# Patient Record
Sex: Male | Born: 1955 | Race: White | Hispanic: No | Marital: Single | Smoking: Current some day smoker
Health system: Southern US, Community
[De-identification: ages and names within clinical notes are randomized; demographics above are authoritative.]

## PROBLEM LIST (undated history)

## (undated) DIAGNOSIS — L309 Dermatitis, unspecified: Secondary | ICD-10-CM

## (undated) DIAGNOSIS — E785 Hyperlipidemia, unspecified: Secondary | ICD-10-CM

## (undated) DIAGNOSIS — I10 Essential (primary) hypertension: Secondary | ICD-10-CM

## (undated) DIAGNOSIS — I4891 Unspecified atrial fibrillation: Secondary | ICD-10-CM

## (undated) DIAGNOSIS — J449 Chronic obstructive pulmonary disease, unspecified: Secondary | ICD-10-CM

## (undated) DIAGNOSIS — E119 Type 2 diabetes mellitus without complications: Secondary | ICD-10-CM

## (undated) HISTORY — DX: Hyperlipidemia, unspecified: E78.5

## (undated) HISTORY — DX: Unspecified atrial fibrillation: I48.91

---
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
## 1978-12-31 HISTORY — PX: ANAL FISSURE REPAIR: SHX2312

## 1988-12-31 HISTORY — PX: MOUTH SURGERY: SHX715

## 2019-10-22 ENCOUNTER — Emergency Department (HOSPITAL_COMMUNITY): Payer: Self-pay

## 2019-10-22 ENCOUNTER — Encounter (HOSPITAL_COMMUNITY): Payer: Self-pay

## 2019-10-22 ENCOUNTER — Inpatient Hospital Stay (HOSPITAL_COMMUNITY)
Admission: EM | Admit: 2019-10-22 | Discharge: 2019-10-30 | DRG: 308 | Disposition: A | Discharge status: Home / Self Care | Payer: Self-pay | Attending: Family Medicine | Admitting: Family Medicine

## 2019-10-22 ENCOUNTER — Other Ambulatory Visit: Payer: Self-pay

## 2019-10-22 DIAGNOSIS — Z8249 Family history of ischemic heart disease and other diseases of the circulatory system: Secondary | ICD-10-CM

## 2019-10-22 DIAGNOSIS — Z79899 Other long term (current) drug therapy: Secondary | ICD-10-CM

## 2019-10-22 DIAGNOSIS — I4891 Unspecified atrial fibrillation: Secondary | ICD-10-CM | POA: Diagnosis present

## 2019-10-22 DIAGNOSIS — M79606 Pain in leg, unspecified: Secondary | ICD-10-CM

## 2019-10-22 DIAGNOSIS — M79604 Pain in right leg: Secondary | ICD-10-CM

## 2019-10-22 DIAGNOSIS — I472 Ventricular tachycardia: Secondary | ICD-10-CM | POA: Diagnosis present

## 2019-10-22 DIAGNOSIS — G4730 Sleep apnea, unspecified: Secondary | ICD-10-CM | POA: Diagnosis present

## 2019-10-22 DIAGNOSIS — R600 Localized edema: Secondary | ICD-10-CM

## 2019-10-22 DIAGNOSIS — I10 Essential (primary) hypertension: Secondary | ICD-10-CM | POA: Diagnosis present

## 2019-10-22 DIAGNOSIS — Z20828 Contact with and (suspected) exposure to other viral communicable diseases: Secondary | ICD-10-CM | POA: Diagnosis present

## 2019-10-22 DIAGNOSIS — R0902 Hypoxemia: Secondary | ICD-10-CM | POA: Diagnosis present

## 2019-10-22 DIAGNOSIS — I110 Hypertensive heart disease with heart failure: Secondary | ICD-10-CM | POA: Diagnosis present

## 2019-10-22 DIAGNOSIS — L03119 Cellulitis of unspecified part of limb: Secondary | ICD-10-CM | POA: Diagnosis present

## 2019-10-22 DIAGNOSIS — Z6841 Body Mass Index (BMI) 40.0 and over, adult: Secondary | ICD-10-CM

## 2019-10-22 DIAGNOSIS — I4819 Other persistent atrial fibrillation: Principal | ICD-10-CM | POA: Diagnosis present

## 2019-10-22 DIAGNOSIS — M79605 Pain in left leg: Secondary | ICD-10-CM

## 2019-10-22 DIAGNOSIS — G4733 Obstructive sleep apnea (adult) (pediatric): Secondary | ICD-10-CM | POA: Diagnosis present

## 2019-10-22 DIAGNOSIS — B356 Tinea cruris: Secondary | ICD-10-CM | POA: Diagnosis present

## 2019-10-22 DIAGNOSIS — Z720 Tobacco use: Secondary | ICD-10-CM | POA: Diagnosis present

## 2019-10-22 DIAGNOSIS — Z801 Family history of malignant neoplasm of trachea, bronchus and lung: Secondary | ICD-10-CM

## 2019-10-22 DIAGNOSIS — G8929 Other chronic pain: Secondary | ICD-10-CM | POA: Diagnosis present

## 2019-10-22 DIAGNOSIS — J449 Chronic obstructive pulmonary disease, unspecified: Secondary | ICD-10-CM | POA: Diagnosis present

## 2019-10-22 DIAGNOSIS — I5031 Acute diastolic (congestive) heart failure: Secondary | ICD-10-CM | POA: Diagnosis present

## 2019-10-22 DIAGNOSIS — F17210 Nicotine dependence, cigarettes, uncomplicated: Secondary | ICD-10-CM | POA: Diagnosis present

## 2019-10-22 DIAGNOSIS — I890 Lymphedema, not elsewhere classified: Secondary | ICD-10-CM | POA: Diagnosis present

## 2019-10-22 DIAGNOSIS — I509 Heart failure, unspecified: Secondary | ICD-10-CM

## 2019-10-22 HISTORY — DX: Essential (primary) hypertension: I10

## 2019-10-22 HISTORY — DX: Chronic obstructive pulmonary disease, unspecified: J44.9

## 2019-10-22 HISTORY — DX: Dermatitis, unspecified: L30.9

## 2019-10-22 LAB — COMPREHENSIVE METABOLIC PANEL
ALT: 20 U/L (ref 0–44)
AST: 19 U/L (ref 15–41)
Albumin: 3.2 g/dL — ABNORMAL LOW (ref 3.5–5.0)
Alkaline Phosphatase: 84 U/L (ref 38–126)
Anion gap: 9 (ref 5–15)
BUN: 10 mg/dL (ref 8–23)
CO2: 32 mmol/L (ref 22–32)
Calcium: 9 mg/dL (ref 8.9–10.3)
Chloride: 97 mmol/L — ABNORMAL LOW (ref 98–111)
Creatinine, Ser: 1.06 mg/dL (ref 0.61–1.24)
GFR calc Af Amer: >60 mL/min (ref >60)
GFR calc non Af Amer: >60 mL/min (ref >60)
Glucose, Bld: 101 mg/dL — ABNORMAL HIGH (ref 70–99)
Potassium: 4.2 mmol/L (ref 3.5–5.1)
Sodium: 138 mmol/L (ref 135–145)
Total Bilirubin: 1.1 mg/dL (ref 0.3–1.2)
Total Protein: 6.3 g/dL — ABNORMAL LOW (ref 6.5–8.1)

## 2019-10-22 LAB — CBC WITH DIFFERENTIAL/PLATELET
Abs Immature Granulocytes: 0.02 10*3/uL (ref 0.00–0.07)
Basophils Absolute: 0.1 10*3/uL (ref 0.0–0.1)
Basophils Relative: 1 %
Eosinophils Absolute: 0.1 10*3/uL (ref 0.0–0.5)
Eosinophils Relative: 2 %
HCT: 51.1 % (ref 39.0–52.0)
Hemoglobin: 15.8 g/dL (ref 13.0–17.0)
Immature Granulocytes: 0 %
Lymphocytes Relative: 23 %
Lymphs Abs: 1.5 10*3/uL (ref 0.7–4.0)
MCH: 31.9 pg (ref 26.0–34.0)
MCHC: 30.9 g/dL (ref 30.0–36.0)
MCV: 103 fL — ABNORMAL HIGH (ref 80.0–100.0)
Monocytes Absolute: 0.6 10*3/uL (ref 0.1–1.0)
Monocytes Relative: 9 %
Neutro Abs: 4.4 10*3/uL (ref 1.7–7.7)
Neutrophils Relative %: 65 %
Platelets: 191 10*3/uL (ref 150–400)
RBC: 4.96 MIL/uL (ref 4.22–5.81)
RDW: 15.8 % — ABNORMAL HIGH (ref 11.5–15.5)
WBC: 6.7 10*3/uL (ref 4.0–10.5)
nRBC: 0 % (ref 0.0–0.2)

## 2019-10-22 LAB — URINALYSIS, ROUTINE W REFLEX MICROSCOPIC
Bacteria, UA: NONE SEEN
Bilirubin Urine: NEGATIVE
Glucose, UA: NEGATIVE mg/dL
Hgb urine dipstick: NEGATIVE
Ketones, ur: 5 mg/dL — AB
Leukocytes,Ua: NEGATIVE
Nitrite: NEGATIVE
Protein, ur: 30 mg/dL — AB
Specific Gravity, Urine: 1.017 (ref 1.005–1.030)
pH: 6 (ref 5.0–8.0)

## 2019-10-22 LAB — BRAIN NATRIURETIC PEPTIDE: B Natriuretic Peptide: 247 pg/mL — ABNORMAL HIGH (ref 0.0–100.0)

## 2019-10-22 LAB — MAGNESIUM: Magnesium: 2.1 mg/dL (ref 1.7–2.4)

## 2019-10-22 LAB — PROTIME-INR
INR: 1.2 (ref 0.8–1.2)
Prothrombin Time: 14.6 seconds (ref 11.4–15.2)

## 2019-10-22 LAB — APTT: aPTT: 30 seconds (ref 24–36)

## 2019-10-22 LAB — PHOSPHORUS: Phosphorus: 3.2 mg/dL (ref 2.5–4.6)

## 2019-10-22 MED ORDER — ACETAMINOPHEN 325 MG PO TABS: 650.0000 mg | ORAL_TABLET | Freq: Four times a day (QID) | ORAL | Status: DC | PRN

## 2019-10-22 MED ORDER — ACETAMINOPHEN 650 MG RE SUPP: 650.0000 mg | Freq: Four times a day (QID) | RECTAL | Status: DC | PRN

## 2019-10-22 MED ORDER — DILTIAZEM HCL-DEXTROSE 125-5 MG/125ML-% IV SOLN (PREMIX)
5.0000 mg/h | INTRAVENOUS | Status: DC
  Administered 2019-10-22: 5 mg/h via INTRAVENOUS
  Filled 2019-10-22: qty 125

## 2019-10-22 MED ORDER — METHYLPREDNISOLONE SODIUM SUCC 125 MG IJ SOLR
125.0000 mg | Freq: Once | INTRAMUSCULAR | Status: AC
  Administered 2019-10-22: 125 mg via INTRAVENOUS
  Filled 2019-10-22: qty 2

## 2019-10-22 MED ORDER — PROCHLORPERAZINE EDISYLATE 10 MG/2ML IJ SOLN: 5.0000 mg | INTRAMUSCULAR | Status: DC | PRN

## 2019-10-22 MED ORDER — HEPARIN BOLUS VIA INFUSION
5000.0000 [IU] | Freq: Once | INTRAVENOUS | Status: AC
  Administered 2019-10-22: 5000 [IU] via INTRAVENOUS

## 2019-10-22 MED ORDER — DILTIAZEM HCL 25 MG/5ML IV SOLN
10.0000 mg | Freq: Once | INTRAVENOUS | Status: AC
  Administered 2019-10-22: 10 mg via INTRAVENOUS

## 2019-10-22 MED ORDER — HEPARIN (PORCINE) 25000 UT/250ML-% IV SOLN
1950.0000 [IU]/h | INTRAVENOUS | Status: DC
  Administered 2019-10-22: 1600 [IU]/h via INTRAVENOUS
  Administered 2019-10-23: 1950 [IU]/h via INTRAVENOUS
  Filled 2019-10-22 (×2): qty 250

## 2019-10-22 MED ORDER — LEVALBUTEROL TARTRATE 45 MCG/ACT IN AERO: 2.0000 | INHALATION_SPRAY | Freq: Four times a day (QID) | RESPIRATORY_TRACT | Status: DC

## 2019-10-22 MED ORDER — SODIUM CHLORIDE 0.9 % IV SOLN
INTRAVENOUS | Status: DC
  Administered 2019-10-22: 16:00:00 via INTRAVENOUS

## 2019-10-22 MED ORDER — LEVALBUTEROL HCL 0.63 MG/3ML IN NEBU
0.6300 mg | INHALATION_SOLUTION | Freq: Four times a day (QID) | RESPIRATORY_TRACT | Status: DC
  Administered 2019-10-23: 0.63 mg via RESPIRATORY_TRACT
  Filled 2019-10-22: qty 3

## 2019-10-22 NOTE — ED Notes (Signed)
No cardizem in the pyxis, contacted pharmacy

## 2019-10-22 NOTE — Progress Notes (Signed)
ANTICOAGULATION CONSULT NOTE - Initial Consult  Pharmacy Consult for heparin Indication: atrial fibrillation  Allergies  Allergen Reactions  . Keflex [Cephalexin] Hives and Nausea And Vomiting    Patient Measurements: Height: 6' (182.9 cm) Weight: (!) 313 lb (142 kg) IBW/kg (Calculated) : 77.6 Heparin Dosing Weight: 110 kg  Vital Signs: Temp: 98.2 F (36.8 C) (10/22 1425) Temp Source: Oral (10/22 1425) BP: 142/94 (10/22 2100) Pulse Rate: 103 (10/22 2100)  Labs: Recent Labs    10/22/19 1524 10/22/19 1528  HGB  --  15.8  HCT  --  51.1  PLT  --  191  APTT 30  --   LABPROT 14.6  --   INR 1.2  --   CREATININE  --  1.06    Estimated Creatinine Clearance: 107 mL/min (by C-G formula based on SCr of 1.06 mg/dL).   Medical History: Past Medical History:  Diagnosis Date  . COPD (chronic obstructive pulmonary disease) ([place])   . Eczema   . Hypertension     Medications:  See electronic med rec  Assessment: 63 y.o. M presents with afib with RVR. To begin heparin gtt. No AC PTA. CBC ok at baseline.  Goal of Therapy:  Heparin level 0.3-0.7 units/ml Monitor platelets by anticoagulation protocol: Yes   Plan:  Heparin IV bolus 5000 units Heparin gtt at 1600 units/hr Will f/u heparin level in 6 hours Daily heparin level and CBC  Sherlon Handing, PharmD, BCPS 10/22/2019,10:02 PM

## 2019-10-22 NOTE — ED Notes (Signed)
Pt still unable to give urine sample at this time.

## 2019-10-22 NOTE — H&P (Signed)
History and Physical    Adam Marquez ULA:[id number] DOB: Jun 01, 1956 DOA: 10/22/2019  PCP: Patient, No Pcp Per   Patient coming from: Home.  I have personally briefly reviewed patient's old medical records in [facility]  Chief Complaint: Bilateral redness and swelling of the legs.  HPI: Adam Marquez is a 63 y.o. male with medical history significant of COPD, eczema, hypertension, morbid obesity, tobacco use who is coming to the emergency department due to progressively worse nonpitting edema and erythema of lower extremities.  He denies fever, chills, sore throat, rhinorrhea, but complains of occasionally productive cough, frequent dyspnea with decreased exercise tolerance and wheezing.  He has also occasional palpitations, but denies chest pain, diaphoresis, nausea or emesis, abdominal pain, diarrhea, constipation, melena or hematochezia.  No dysuria, frequency or hematuria.  Denies polyuria, polydipsia, polyphagia or blurred vision.  ED Course: Initial vital signs temperature 98.2 F, pulse 120, respiration 19, blood pressure 141/106 mmHg and O2 sat 96% on room air.  The patient was started on the Cardizem infusion after giving a 10 mg Cardizem bolus.    His urinalysis showed ketonuria 05 and proteinuria 30 mg/dL.  The rest of the UA is within normal limits.  CBC is normal except for an elevated MCV and RDW.  B12 and folic acid levels have been requested.  PT/INR/APTT within normal limits.  BNP was 247.0 pg milliliter.  CMP shows a chloride of 97 mmol/L.  The rest of the electrolytes are within normal limits.  Glucose slightly elevated at 101 mg/dL.  Renal function was normal.  Total protein 6.3 and albumin 3.2 g/dL.  The rest of the hepatic functions are within expected range.  His chest radiograph show cardiomegaly, but no other findings.  Review of Systems: As per HPI otherwise 10 point review of systems negative.   Past Medical History:  Diagnosis Date  . COPD  (chronic obstructive pulmonary disease) (HCC)   . Eczema   . Hypertension     Past Surgical History:  Procedure Laterality Date  . ANAL FISSURE REPAIR  1980  . MOUTH SURGERY  1990     reports that he has been smoking. He has been smoking about 0.50 packs per day. He has never used smokeless tobacco. He reports current alcohol use. He reports that he does not use drugs.  Allergies  Allergen Reactions  . Keflex [Cephalexin] Hives and Nausea And Vomiting    Family History  Problem Relation Age of Onset  . Lung cancer Mother   . Heart disease Father    Prior to Admission medications   Medication Sig Start Date End Date Taking? Authorizing Provider  loratadine (CLARITIN) 10 MG tablet Take 10 mg by mouth daily as needed for allergies.   Yes [provider]  Polyvinyl Alcohol-Povidone (CLEAR EYES ALL SEASONS) 5-6 MG/ML SOLN Apply 1 drop to eye daily as needed (for allergies).   Yes [provider]    Physical Exam: Vitals:   10/22/19 1800 10/22/19 1900 10/22/19 2000 10/22/19 2100  BP: (!) 137/91 134/84 136/88 (!) 142/94  Pulse: (!) 125 (!) 101 (!) 105 (!) 103  Resp:      Temp:      TempSrc:      SpO2: 98% 96% 96% 96%  Weight:      Height:        Constitutional: NAD, calm, comfortable Eyes: PERRL, lids and conjunctivae normal ENMT: Mucous membranes are moist. Posterior pharynx clear of any exudate or lesions. Neck: normal,  supple, no masses, no thyromegaly Respiratory: Decreased breath sounds with bilateral rhonchi and wheezing, no crackles. Normal respiratory effort. No accessory muscle use.  Cardiovascular: Irregularly irregular no murmurs / rubs / gallops.  Stage II lymphedema.  No extremity edema. 2+ pedal pulses. No carotid bruits.  Abdomen: Obese, nondistended.  Bowel sounds positive.  Soft, no tenderness, no masses palpated. No hepatosplenomegaly. Musculoskeletal: no clubbing / cyanosis. Good ROM, no contractures. Normal muscle tone.  Skin: Mild  erythema on both pretibial areas. Neurologic: CN 2-12 grossly intact. Sensation intact, DTR normal. Strength 5/5 in all 4.  Psychiatric: Normal judgment and insight. Alert and oriented x 3. Normal mood.   Labs on Admission: I have personally reviewed following labs and imaging studies  CBC: Recent Labs  Lab 10/22/19 1528  WBC 6.7  NEUTROABS 4.4  HGB 15.8  HCT 51.1  MCV 103.0*  PLT 638   Basic Metabolic Panel: Recent Labs  Lab 10/22/19 1524 10/22/19 1528  NA  --  138  K  --  4.2  CL  --  97*  CO2  --  32  GLUCOSE  --  101*  BUN  --  10  CREATININE  --  1.06  CALCIUM  --  9.0  MG 2.1  --   PHOS 3.2  --    GFR: Estimated Creatinine Clearance: 107 mL/min (by C-G formula based on SCr of 1.06 mg/dL). Liver Function Tests: Recent Labs  Lab 10/22/19 1528  AST 19  ALT 20  ALKPHOS 84  BILITOT 1.1  PROT 6.3*  ALBUMIN 3.2*   No results for input(s): LIPASE, AMYLASE in the last 168 hours. No results for input(s): AMMONIA in the last 168 hours. Coagulation Profile: Recent Labs  Lab 10/22/19 1524  INR 1.2   Cardiac Enzymes: No results for input(s): CKTOTAL, CKMB, CKMBINDEX, TROPONINI in the last 168 hours. BNP (last 3 results) No results for input(s): PROBNP in the last 8760 hours. HbA1C: No results for input(s): HGBA1C in the last 72 hours. CBG: No results for input(s): GLUCAP in the last 168 hours. Lipid Profile: No results for input(s): CHOL, HDL, LDLCALC, TRIG, CHOLHDL, LDLDIRECT in the last 72 hours. Thyroid Function Tests: No results for input(s): TSH, T4TOTAL, FREET4, T3FREE, THYROIDAB in the last 72 hours. Anemia Panel: No results for input(s): VITAMINB12, FOLATE, FERRITIN, TIBC, IRON, RETICCTPCT in the last 72 hours. Urine analysis: No results found for: COLORURINE, APPEARANCEUR, LABSPEC, [place], GLUCOSEU, HGBUR, BILIRUBINUR, KETONESUR, PROTEINUR, UROBILINOGEN, NITRITE, LEUKOCYTESUR  Radiological Exams on Admission: Dg Chest Port 1 View  Result  Date: 10/22/2019 CLINICAL DATA:  Shortness of breath EXAM: PORTABLE CHEST 1 VIEW COMPARISON:  None. FINDINGS: Lungs are clear. There is cardiomegaly with pulmonary vascularity within normal limits. No adenopathy. No bone lesions. IMPRESSION: Cardiomegaly.  No edema or consolidation. Electronically Signed   By: Lowella Grip III M.D.   On: 10/22/2019 15:52    EKG: Independently reviewed.  Vent. rate 138 BPM PR interval * ms QRS duration 96 ms QT/QTc 280/424 ms P-R-T axes * 50 52 Sinus rhythm. Positive PVCs.  Assessment/Plan Principal Problem:   Atrial fibrillation with RVR (HCC) Admit to stepdown/inpatient. Supplemental oxygen. Continue Cardizem for rate control. Heparin per pharmacy. Optimize electrolytes. Check echocardiogram. Obtain cardiology evaluation.  Active Problems:   Hypertension Has been untreated for a while. Does not remember the name of previous medication. Transition to beta-blocker or non-dihydropyridine CCB.    COPD (chronic obstructive pulmonary disease) (HCC) Single dose Solu-Medrol. Scheduled and as needed Xopenex. Smoking  cessation advised.    Tobacco use Nicotine replacement therapy O for. Staff to provide tobacco cessation information.    Lymphedema Will need compression stockings. Recommended weight loss and other lifestyle modifications. BNP is slightly elevated and edema is nonpitting   DVT prophylaxis: On heparin. Code Status: Full code. Family Communication:  Disposition Plan: Admit for rate control and anticoagulation. Consults called: Routine cardiology consult. Admission status: Inpatient/stepdown.   Bobette Moavid Manuel Nasia Cannan MD [facility]  If 7PM-7AM, please contact night-coverage [email or web address]  10/22/2019, 10:18 PM   This document was prepared using Dragon voice recognition software and may contain some unintended transcription errors.

## 2019-10-22 NOTE — ED Triage Notes (Signed)
Pt has bilateral redness and swelling to lower legs. Has been using compression stockings that have helped, but is now experiencing more redness. Denies fevers.

## 2019-10-22 NOTE — ED Provider Notes (Signed)
[facility] EMERGENCY DEPARTMENT Provider Note   CSN: [id number] Arrival date & time: 10/22/19  1314     History   Chief Complaint Chief Complaint  Patient presents with  . Cellulitis    HPI Adam Marquez is a 63 y.o. male.     Patient brought in by POV.  Patient with a concern of bilateral redness and swelling to both legs.  Is been using compression stockings that have helped but now is experiencing more redness.  Denies any fevers.  No chest pain may be some slight shortness of breath.  No fevers no congestion.  Past medical history significant for hypertension COPD.  Currently not on any medications.  Blood pressure was 141/106 oxygen saturations 96%.  Heart rate was 1 20-1 30.     Past Medical History:  Diagnosis Date  . COPD (chronic obstructive pulmonary disease) (HCC)   . Eczema   . Hypertension     There are no active problems to display for this patient.         Home Medications    Prior to Admission medications   Medication Sig Start Date End Date Taking? Authorizing Provider  loratadine (CLARITIN) 10 MG tablet Take 10 mg by mouth daily as needed for allergies.   Yes [provider]  Polyvinyl Alcohol-Povidone (CLEAR EYES ALL SEASONS) 5-6 MG/ML SOLN Apply 1 drop to eye daily as needed (for allergies).   Yes [provider]    Family History No family history on file.  Social History Social History   Tobacco Use  . Smoking status: Not on file  Substance Use Topics  . Alcohol use: Not on file  . Drug use: Not on file     Allergies   Keflex [cephalexin]   Review of Systems Review of Systems  Constitutional: Negative for chills and fever.  HENT: Negative for congestion, rhinorrhea and sore throat.   Eyes: Negative for visual disturbance.  Respiratory: Positive for shortness of breath. Negative for cough.   Cardiovascular: Positive for leg swelling. Negative for chest pain.  Gastrointestinal: Negative for  abdominal pain, diarrhea, nausea and vomiting.  Genitourinary: Negative for dysuria.  Musculoskeletal: Negative for back pain and neck pain.  Skin: Positive for rash.  Neurological: Negative for dizziness, light-headedness and headaches.  Hematological: Does not bruise/bleed easily.  Psychiatric/Behavioral: Negative for confusion.     Physical Exam Updated Vital Signs BP (!) 137/91   Pulse (!) 125   Temp 98.2 F (36.8 C) (Oral)   Resp 19   Ht 1.829 m (6')   Wt (!) 142 kg   SpO2 98%   BMI 42.45 kg/m   Physical Exam Vitals signs and nursing note reviewed.  Constitutional:      General: He is not in acute distress.    Appearance: Normal appearance. He is well-developed.  HENT:     Head: Normocephalic and atraumatic.  Eyes:     Extraocular Movements: Extraocular movements intact.     Conjunctiva/sclera: Conjunctivae normal.     Pupils: Pupils are equal, round, and reactive to light.  Neck:     Musculoskeletal: Normal range of motion and neck supple.  Cardiovascular:     Rate and Rhythm: Tachycardia present. Rhythm irregular.     Heart sounds: No murmur.  Pulmonary:     Effort: Pulmonary effort is normal. No respiratory distress.     Breath sounds: Normal breath sounds.  Abdominal:     Palpations: Abdomen is soft.     Tenderness:  There is no abdominal tenderness.  Musculoskeletal:     Right lower leg: Edema present.     Left lower leg: Edema present.  Skin:    General: Skin is warm and dry.     Capillary Refill: Capillary refill takes less than 2 seconds.     Comments: Mark swelling to both lower extremities swelling even up into the thighs.  Erythema up to the knees.  Equal bilaterally.  Think is secondary to chronic swelling.  Questionable skin break at the anterior ankle on the right leg.   Neurological:     General: No focal deficit present.     Mental Status: He is alert and oriented to person, place, and time.      ED Treatments / Results  Labs (all labs  ordered are listed, but only abnormal results are displayed) Labs Reviewed  COMPREHENSIVE METABOLIC PANEL - Abnormal; Notable for the following components:      Result Value   Chloride 97 (*)    Glucose, Bld 101 (*)    Total Protein 6.3 (*)    Albumin 3.2 (*)    All other components within normal limits  CBC WITH DIFFERENTIAL/PLATELET - Abnormal; Notable for the following components:   MCV 103.0 (*)    RDW 15.8 (*)    All other components within normal limits  BRAIN NATRIURETIC PEPTIDE - Abnormal; Notable for the following components:   B Natriuretic Peptide 247.0 (*)    All other components within normal limits  SARS CORONAVIRUS 2 (TAT 6-24 HRS)  URINALYSIS, ROUTINE W REFLEX MICROSCOPIC    EKG EKG Interpretation  Date/Time:  Thursday October 22 2019 14:28:35 EDT Ventricular Rate:  138 PR Interval:    QRS Duration: 96 QT Interval:  280 QTC Calculation: 424 R Axis:   50 Text Interpretation:  Atrial fibrillation with rapid ventricular response Low voltage QRS Cannot rule out Anterior infarct , age undetermined Abnormal ECG No previous ECGs available Confirmed by Vanetta MuldersZackowski, Dravyn Severs [phone]) on 10/22/2019 3:17:52 PM   Radiology Dg Chest Port 1 View  Result Date: 10/22/2019 CLINICAL DATA:  Shortness of breath EXAM: PORTABLE CHEST 1 VIEW COMPARISON:  None. FINDINGS: Lungs are clear. There is cardiomegaly with pulmonary vascularity within normal limits. No adenopathy. No bone lesions. IMPRESSION: Cardiomegaly.  No edema or consolidation. Electronically Signed   By: Bretta BangWilliam  Woodruff III M.D.   On: 10/22/2019 15:52    Procedures Procedures (including critical care time)  CRITICAL CARE Performed by: Vanetta MuldersScott Cozy Veale Total critical care time: 30 minutes Critical care time was exclusive of separately billable procedures and treating other patients. Critical care was necessary to treat or prevent imminent or life-threatening deterioration. Critical care was time spent personally by me  on the following activities: development of treatment plan with patient and/or surrogate as well as nursing, discussions with consultants, evaluation of patient's response to treatment, examination of patient, obtaining history from patient or surrogate, ordering and performing treatments and interventions, ordering and review of laboratory studies, ordering and review of radiographic studies, pulse oximetry and re-evaluation of patient's condition.   Medications Ordered in ED Medications  0.9 %  sodium chloride infusion ( Intravenous New Bag/Given 10/22/19 1602)  diltiazem (CARDIZEM) 125 mg in dextrose 5% 125 mL (1 mg/mL) infusion (10 mg/hr Intravenous Rate/Dose Change 10/22/19 1813)  diltiazem (CARDIZEM) injection 10 mg (10 mg Intravenous Given 10/22/19 1602)     Initial Impression / Assessment and Plan / ED Course  I have reviewed the triage vital signs and the  nursing notes.  Pertinent labs & imaging results that were available during my care of the patient were reviewed by me and considered in my medical decision making (see chart for details).       Cardiac monitoring EKG consistent with atrial fibrillation with rapid heart rate.  Anywhere from 1 20-1 30.  Patient with no known history of atrial fibrillation.  Started on diltiazem given 10 mg no real effect started on drip.  Slowly brought heart rate down.  BNP slightly elevated chest x-ray with cardiomegaly but no pulmonary edema.  Renal functions normal.  No leukocytosis no fevers.  Think that the lower extremity swelling and redness is chronic skin changes.  Not acute in infection.  Have contact the hospitalist for the new onset atrial fibrillation.   Final Clinical Impressions(s) / ED Diagnoses   Final diagnoses:  Atrial fibrillation with RVR ([place])  Bilateral lower extremity edema    ED Discharge Orders    None       Fredia Sorrow, MD 10/22/19 1948

## 2019-10-22 NOTE — ED Notes (Signed)
This nurse went into get permission from pt to speak with friend sally carter when going into room this nurse sees pt sitting on floor with his back up against the bed. This nurse asked if pt had fallen and pt states "yes, I went to sit onto the stool and it slipped out from under me and I landed on my bottom." this nurse asked if pt if he had hit his head or anything and he stated " no I only landed on my bottom." pt placed back into bed, and VSS.

## 2019-10-23 ENCOUNTER — Encounter (HOSPITAL_COMMUNITY): Payer: Self-pay

## 2019-10-23 ENCOUNTER — Inpatient Hospital Stay (HOSPITAL_COMMUNITY): Payer: Self-pay

## 2019-10-23 DIAGNOSIS — I4891 Unspecified atrial fibrillation: Secondary | ICD-10-CM

## 2019-10-23 DIAGNOSIS — I509 Heart failure, unspecified: Secondary | ICD-10-CM

## 2019-10-23 DIAGNOSIS — I890 Lymphedema, not elsewhere classified: Secondary | ICD-10-CM

## 2019-10-23 LAB — ECHOCARDIOGRAM COMPLETE
Height: 72 in
Weight: 5336.9 oz

## 2019-10-23 LAB — CBC
HCT: 52.3 % — ABNORMAL HIGH (ref 39.0–52.0)
Hemoglobin: 16.1 g/dL (ref 13.0–17.0)
MCH: 31.6 pg (ref 26.0–34.0)
MCHC: 30.8 g/dL (ref 30.0–36.0)
MCV: 102.8 fL — ABNORMAL HIGH (ref 80.0–100.0)
Platelets: 171 10*3/uL (ref 150–400)
RBC: 5.09 MIL/uL (ref 4.22–5.81)
RDW: 15.5 % (ref 11.5–15.5)
WBC: 6.8 10*3/uL (ref 4.0–10.5)
nRBC: 0 % (ref 0.0–0.2)

## 2019-10-23 LAB — HEPARIN LEVEL (UNFRACTIONATED): Heparin Unfractionated: 0.16 IU/mL — ABNORMAL LOW (ref 0.30–0.70)

## 2019-10-23 LAB — SARS CORONAVIRUS 2 (TAT 6-24 HRS): SARS Coronavirus 2: NEGATIVE

## 2019-10-23 LAB — HEMOGLOBIN A1C
Hgb A1c MFr Bld: 5.9 % — ABNORMAL HIGH (ref 4.8–5.6)
Mean Plasma Glucose: 122.63 mg/dL

## 2019-10-23 LAB — TSH: TSH: 2.139 u[IU]/mL (ref 0.350–4.500)

## 2019-10-23 LAB — MRSA PCR SCREENING: MRSA by PCR: NEGATIVE

## 2019-10-23 LAB — FOLATE: Folate: 9 ng/mL (ref >5.9)

## 2019-10-23 LAB — VITAMIN B12: Vitamin B-12: 431 pg/mL (ref 180–914)

## 2019-10-23 LAB — T4, FREE: Free T4: 1.19 ng/dL — ABNORMAL HIGH (ref 0.61–1.12)

## 2019-10-23 LAB — HIV ANTIBODY (ROUTINE TESTING W REFLEX): HIV Screen 4th Generation wRfx: NONREACTIVE

## 2019-10-23 MED ORDER — CLINDAMYCIN PHOSPHATE 600 MG/50ML IV SOLN
600.0000 mg | Freq: Three times a day (TID) | INTRAVENOUS | Status: AC
  Administered 2019-10-23 – 2019-10-27 (×15): 600 mg via INTRAVENOUS
  Filled 2019-10-23 (×15): qty 50

## 2019-10-23 MED ORDER — METOPROLOL TARTRATE 50 MG PO TABS
50.0000 mg | ORAL_TABLET | Freq: Three times a day (TID) | ORAL | Status: DC
  Administered 2019-10-23 – 2019-10-25 (×7): 50 mg via ORAL
  Filled 2019-10-23 (×6): qty 1

## 2019-10-23 MED ORDER — NICOTINE 14 MG/24HR TD PT24
14.0000 mg | MEDICATED_PATCH | Freq: Every day | TRANSDERMAL | Status: DC
  Administered 2019-10-23 – 2019-10-30 (×8): 14 mg via TRANSDERMAL
  Filled 2019-10-23 (×8): qty 1

## 2019-10-23 MED ORDER — HEPARIN BOLUS VIA INFUSION
3000.0000 [IU] | Freq: Once | INTRAVENOUS | Status: AC
  Administered 2019-10-23: 06:00:00 3000 [IU] via INTRAVENOUS
  Filled 2019-10-23: qty 3000

## 2019-10-23 MED ORDER — FUROSEMIDE 10 MG/ML IJ SOLN
40.0000 mg | Freq: Two times a day (BID) | INTRAMUSCULAR | Status: DC
  Administered 2019-10-23 – 2019-10-26 (×7): 40 mg via INTRAVENOUS
  Filled 2019-10-23 (×8): qty 4

## 2019-10-23 MED ORDER — LEVALBUTEROL HCL 0.63 MG/3ML IN NEBU
0.6300 mg | INHALATION_SOLUTION | Freq: Four times a day (QID) | RESPIRATORY_TRACT | Status: DC | PRN
  Administered 2019-10-26: 08:00:00 0.63 mg via RESPIRATORY_TRACT

## 2019-10-23 MED ORDER — CHLORHEXIDINE GLUCONATE CLOTH 2 % EX PADS
6.0000 | MEDICATED_PAD | Freq: Every day | CUTANEOUS | Status: DC
  Administered 2019-10-23 – 2019-10-30 (×8): 6 via TOPICAL

## 2019-10-23 MED ORDER — LEVALBUTEROL HCL 0.63 MG/3ML IN NEBU
0.6300 mg | INHALATION_SOLUTION | Freq: Three times a day (TID) | RESPIRATORY_TRACT | Status: DC
  Filled 2019-10-23: qty 3

## 2019-10-23 MED ORDER — APIXABAN 5 MG PO TABS
5.0000 mg | ORAL_TABLET | Freq: Two times a day (BID) | ORAL | Status: DC
  Administered 2019-10-23 – 2019-10-30 (×15): 5 mg via ORAL
  Filled 2019-10-23 (×15): qty 1

## 2019-10-23 MED ORDER — DILTIAZEM HCL 100 MG IV SOLR
INTRAVENOUS | Status: AC
  Filled 2019-10-23: qty 100

## 2019-10-23 MED ORDER — DILTIAZEM HCL-DEXTROSE 100-5 MG/100ML-% IV SOLN (PREMIX)
5.0000 mg/h | INTRAVENOUS | Status: DC
  Administered 2019-10-23: 06:00:00 5 mg/h via INTRAVENOUS
  Filled 2019-10-23: qty 100

## 2019-10-23 MED ORDER — DILTIAZEM HCL 100 MG IV SOLR
5.0000 mg/h | INTRAVENOUS | Status: DC
  Administered 2019-10-23: 09:00:00 7.5 mg/h via INTRAVENOUS
  Filled 2019-10-23: qty 100

## 2019-10-23 NOTE — Progress Notes (Signed)
PROGRESS NOTE  Adam Marquez LZJ:[id number] DOB: 09/22/1956 DOA: 10/22/2019 PCP: Patient, No Pcp Per  Brief History:  63 year old male with a history of hypertension, morbid obesity, tobacco abuse, and suspected COPD presenting with progressive shortness of breath that was significantly worsened over the past 2 days.  In addition, he is concerned about increasing lower extremity edema and erythema of his bilateral lower extremities for the past 2 days.  The patient has not been seen a physician in approximately 1 year since he moved to [place] from [place].  He states that he has not been on any medications but previously was on antihypertensive medications.  He continues to smoke 1/2 pack/day but previously smoked 1-1/2 packs x 30 years.  The patient also describes orthopnea type symptoms requiring him to sleep sitting upright.  He denies any fevers, chills, chest pain, coughing, hemoptysis, nausea, vomiting, direct abdominal pain. In the emergency department, the patient was afebrile and hemodynamically stable with oxygen saturations of 93% on 2 L.  BMP and CBC were essentially unremarkable.  BNP was 247.  Chest x-ray showed cardiomegaly with mild increased interstitial markings.  EKG showed atrial fibrillation with nonspecific T wave changes.  Assessment/Plan: Acute CHF, type unspecified -Work-up in progress -Echocardiogram -Start IV furosemide 40 mg IV twice daily -Accurate I's and O's  Atrial fibrillation with RVR, type unspecified -New diagnosis -Continue diltiazem drip -Start apixaban -Discontinue IV heparin -TSH -personally reviewed EKG--afib, nonspecific T wave changes -personally reviewed CXR--cardiomegaly, increase interstitial marking -CHADSVASc at least 2  Tobacco abuse I have discussed tobacco cessation with the patient.  I have counseled the patient regarding the negative impacts of continued tobacco use including but not limited to lung cancer,  COPD, and cardiovascular disease.  I have discussed alternatives to tobacco and modalities that may help facilitate tobacco cessation including but not limited to biofeedback, hypnosis, and medications.  Total time spent with tobacco counseling was 4 minutes. -Patient has a least 45-pack-year history  Morbid obesity -BMI 45.24  Essential hypertension -Anticipate improvement with diltiazem and diuresis  Lower extremity edema -Venous duplex rule out DVT  ??  Cellulitis lower extremity -Start empiric clindamycin    Disposition Plan:   Home in 2-3 days  Family Communication:   No Family at bedside  Consultants:  cardiology  Code Status:  FULL   DVT Prophylaxis:  apixaban   Procedures: As Listed in Progress Note Above  Antibiotics: cefazolin    Subjective: Patient continues to complain of orthopnea type symptoms.  He states his breathing is a bit better than yesterday.  He complains of lower extremity edema and some pain in his legs.  He denies any coughing, hemoptysis, fevers, chills, headache, neck pain.  Objective: Vitals:   10/23/19 0530 10/23/19 0600 10/23/19 0741 10/23/19 0838  BP: 137/77 (!) 133/94  (!) 131/105  Pulse: 100 63  (!) 111  Resp: (!) 23 17  20   Temp:   (!) 97.5 F (36.4 C)   TempSrc:   Oral   SpO2: 95% 93%  94%  Weight:      Height:        Intake/Output Summary (Last 24 hours) at 10/23/2019 0905 Last data filed at 10/23/2019 0240 Gross per 24 hour  Intake 259.48 ml  Output -  Net 259.48 ml   Weight change:  Exam:   General:  Pt is alert, follows commands appropriately, not in acute distress  HEENT: No icterus, No thrush,  No neck mass, [place]/AT  Cardiovascular: IRRR, S1/S2, no rubs, no gallops  Respiratory: bibasilar crackles. No wheeze  Abdomen: Soft/+BS, non tender, non distended, no guarding  Extremities: 2 + LE edema, No lymphangitis, No petechiae, No rashes, no synovitis   Data Reviewed: I have personally reviewed following  labs and imaging studies Basic Metabolic Panel: Recent Labs  Lab 10/22/19 1524 10/22/19 1528  NA  --  138  K  --  4.2  CL  --  97*  CO2  --  32  GLUCOSE  --  101*  BUN  --  10  CREATININE  --  1.06  CALCIUM  --  9.0  MG 2.1  --   PHOS 3.2  --    Liver Function Tests: Recent Labs  Lab 10/22/19 1528  AST 19  ALT 20  ALKPHOS 84  BILITOT 1.1  PROT 6.3*  ALBUMIN 3.2*   No results for input(s): LIPASE, AMYLASE in the last 168 hours. No results for input(s): AMMONIA in the last 168 hours. Coagulation Profile: Recent Labs  Lab 10/22/19 1524  INR 1.2   CBC: Recent Labs  Lab 10/22/19 1528 10/23/19 0424  WBC 6.7 6.8  NEUTROABS 4.4  --   HGB 15.8 16.1  HCT 51.1 52.3*  MCV 103.0* 102.8*  PLT 191 171   Cardiac Enzymes: No results for input(s): CKTOTAL, CKMB, CKMBINDEX, TROPONINI in the last 168 hours. BNP: Invalid input(s): POCBNP CBG: No results for input(s): GLUCAP in the last 168 hours. HbA1C: No results for input(s): HGBA1C in the last 72 hours. Urine analysis:    Component Value Date/Time   COLORURINE YELLOW 10/22/2019 2237   APPEARANCEUR CLEAR 10/22/2019 2237   LABSPEC 1.017 10/22/2019 2237   PHURINE 6.0 10/22/2019 2237   GLUCOSEU NEGATIVE 10/22/2019 2237   HGBUR NEGATIVE 10/22/2019 2237   BILIRUBINUR NEGATIVE 10/22/2019 2237   KETONESUR 5 (A) 10/22/2019 2237   PROTEINUR 30 (A) 10/22/2019 2237   NITRITE NEGATIVE 10/22/2019 2237   LEUKOCYTESUR NEGATIVE 10/22/2019 2237   Sepsis Labs: @LABRCNTIP (procalcitonin:4,lacticidven:4) ) Recent Results (from the past 240 hour(s))  SARS CORONAVIRUS 2 (Adam Marquez 6-24 HRS) Nasopharyngeal Nasopharyngeal Swab     Status: None   Collection Time: 10/22/19  5:00 PM   Specimen: Nasopharyngeal Swab  Result Value Ref Range Status   SARS Coronavirus 2 NEGATIVE NEGATIVE Final    Comment: (NOTE) SARS-CoV-2 target nucleic acids are NOT DETECTED. The SARS-CoV-2 RNA is generally detectable in upper and lower respiratory  specimens during the acute phase of infection. Negative results do not preclude SARS-CoV-2 infection, do not rule out co-infections with other pathogens, and should not be used as the sole basis for treatment or other patient management decisions. Negative results must be combined with clinical observations, patient history, and epidemiological information. The expected result is Negative. Fact Sheet for Patients: HairSlick.nohttps://www.fda.gov/media/138098/download Fact Sheet for Healthcare Providers: quierodirigir.comhttps://www.fda.gov/media/138095/download This test is not yet approved or cleared by the [place] FDA and  has been authorized for detection and/or diagnosis of SARS-CoV-2 by FDA under an Emergency Use Authorization (EUA). This EUA will remain  in effect (meaning this test can be used) for the duration of the COVID-19 declaration under Section 56 4(b)(1) of the Act, 21 U.S.C. section 360bbb-3(b)(1), unless the authorization is terminated or revoked sooner. Performed at [facility], [address] [place], [place] [address]   MRSA PCR Screening     Status: None   Collection Time: 10/23/19 12:31 AM   Specimen: Nasal Mucosa; Nasopharyngeal  Result Value Ref Range Status   MRSA by PCR NEGATIVE NEGATIVE Final    Comment:        The GeneXpert MRSA Assay (FDA approved for NASAL specimens only), is one component of a comprehensive MRSA colonization surveillance program. It is not intended to diagnose MRSA infection nor to guide or monitor treatment for MRSA infections. Performed at [facility], [name], [place], [place] [address]      Scheduled Meds: . apixaban  5 mg Oral BID  . Chlorhexidine Gluconate Cloth  6 each Topical Daily  . furosemide  40 mg Intravenous BID  . metoprolol tartrate  50 mg Oral TID   Continuous Infusions: . sodium chloride 10 mL/hr at 10/22/19 1602  . diltiazem (CARDIZEM) infusion 7.5 mg/hr (10/23/19 9390)    Procedures/Studies: Dg Chest  Port 1 View  Result Date: 10/22/2019 CLINICAL DATA:  Shortness of breath EXAM: PORTABLE CHEST 1 VIEW COMPARISON:  None. FINDINGS: Lungs are clear. There is cardiomegaly with pulmonary vascularity within normal limits. No adenopathy. No bone lesions. IMPRESSION: Cardiomegaly.  No edema or consolidation. Electronically Signed   By: Bretta Bang III M.D.   On: 10/22/2019 15:52    Catarina Hartshorn, DO  [facility] Pager [phone]  If 7PM-7AM, please contact night-coverage [email or web address] Password [email or web address] 10/23/2019, 9:05 AM   LOS: 1 day

## 2019-10-23 NOTE — Progress Notes (Signed)
Dr. Olevia Bowens made aware that patients Cardizem gtt d/c due to  Patients HR decreasing to 40s. MD stated to restart gtt if HR sustained >110. Will continue to monitor pt

## 2019-10-23 NOTE — Consult Note (Signed)
Cardiology Consultation:   Patient ID: Adam Marquez MRN: [id number]; DOB: 17-Jul-1956  Admit date: 10/22/2019 Date of Consult: 10/23/2019  Primary Care Provider: Patient, No Pcp Per Primary Cardiologist: New, Dr Carlyle Dolly MD Primary Electrophysiologist:  None    Patient Profile:   Adam Marquez is a 63 y.o. male with a hx of HTN and COPD who is being seen today for the evaluation of palpitations and SOB at the request of Dr Tat.  History of Present Illness:   Adam Marquez 63 yo male history of COPD, HTN, obesity admitted with LE redness and edema. Has had some DOE and cough, +palpitations. +orthopnea. Symptoms have been progressing over the last several weeks. Reports no prior issues with LE edema before.    Mg 2.1 K 4.2 Cr 1.06 WBC 6.7 Hgb 15.8 Plt 191 BNP 247  COVID neg CXR carduinegaly EKG afib RVR   Heart Pathway Score:     Past Medical History:  Diagnosis Date  . COPD (chronic obstructive pulmonary disease) ([place])   . Eczema   . Hypertension     Past Surgical History:  Procedure Laterality Date  . ANAL FISSURE REPAIR  1980  . MOUTH SURGERY  1990       Inpatient Medications: Scheduled Meds: . Chlorhexidine Gluconate Cloth  6 each Topical Daily  . levalbuterol  0.63 mg Nebulization TID   Continuous Infusions: . sodium chloride 10 mL/hr at 10/22/19 1602  . diltiazem (CARDIZEM) infusion    . heparin 1,950 Units/hr (10/23/19 7169)   PRN Meds: acetaminophen **OR** acetaminophen, prochlorperazine  Allergies:    Allergies  Allergen Reactions  . Keflex [Cephalexin] Hives and Nausea And Vomiting    Social History:   Social History   Socioeconomic History  . Marital status: Single    Spouse name: Not on file  . Number of children: Not on file  . Years of education: Not on file  . Highest education level: Not on file  Occupational History  . Not on file  Social Needs  . Financial resource strain: Not on file  . Food insecurity    Worry: Not on file    Inability: Not on file  . Transportation needs    Medical: Not on file    Non-medical: Not on file  Tobacco Use  . Smoking status: Current Every Day Smoker    Packs/day: 0.50  . Smokeless tobacco: Never Used  Substance and Sexual Activity  . Alcohol use: Yes    Comment: a Six pack a week.  . Drug use: Never  . Sexual activity: Not on file  Lifestyle  . Physical activity    Days per week: Not on file    Minutes per session: Not on file  . Stress: Not on file  Relationships  . Social Herbalist on phone: Not on file    Gets together: Not on file    Attends religious service: Not on file    Active member of club or organization: Not on file    Attends meetings of clubs or organizations: Not on file    Relationship status: Not on file  . Intimate partner violence    Fear of current or ex partner: Not on file    Emotionally abused: Not on file    Physically abused: Not on file    Forced sexual activity: Not on file  Other Topics Concern  . Not on file  Social History Narrative  . Not on file  Family History:   Family History  Problem Relation Age of Onset  . Lung cancer Mother   . Heart disease Father      ROS:  Please see the history of present illness.  All other ROS reviewed and negative.     Physical Exam/Data:   Vitals:   10/23/19 0500 10/23/19 0530 10/23/19 0600 10/23/19 0741  BP: (!) 137/98 137/77 (!) 133/94   Pulse: (!) 103 100 63   Resp:  (!) 23 17   Temp:    (!) 97.5 F (36.4 C)  TempSrc:    Oral  SpO2: 96% 95% 93%   Weight:      Height:        Intake/Output Summary (Last 24 hours) at 10/23/2019 0831 Last data filed at 10/23/2019 0317 Gross per 24 hour  Intake 259.48 ml  Output -  Net 259.48 ml   Last 3 Weights 10/23/2019 10/22/2019  Weight (lbs) 333 lb 8.9 oz 313 lb  Weight (kg) 151.3 kg 141.976 kg     Body mass index is 45.24 kg/m.  General:  Well nourished, well developed, in no acute distress  HEENT: normal Lymph: no adenopathy Neck: elevated JV Endocrine:  No thryomegaly Vascular: No carotid bruits; FA pulses 2+ bilaterally without bruits  Cardiac:  Irreg, no m/r/g Lungs:  clear to auscultation bilaterally, no wheezing, rhonchi or rales  Abd: soft, nontender, no hepatomegaly  Ext: 1+ bialteral LE edema Musculoskeletal:  No deformities, BUE and BLE strength normal and equal Skin: warm and dry  Neuro:  CNs 2-12 intact, no focal abnormalities noted Psych:  Normal affect    Laboratory Data:  High Sensitivity Troponin:  No results for input(s): TROPONINIHS in the last 720 hours.   Chemistry Recent Labs  Lab 10/22/19 1528  NA 138  K 4.2  CL 97*  CO2 32  GLUCOSE 101*  BUN 10  CREATININE 1.06  CALCIUM 9.0  GFRNONAA >60  GFRAA >60  ANIONGAP 9    Recent Labs  Lab 10/22/19 1528  PROT 6.3*  ALBUMIN 3.2*  AST 19  ALT 20  ALKPHOS 84  BILITOT 1.1   Hematology Recent Labs  Lab 10/22/19 1528 10/23/19 0424  WBC 6.7 6.8  RBC 4.96 5.09  HGB 15.8 16.1  HCT 51.1 52.3*  MCV 103.0* 102.8*  MCH 31.9 31.6  MCHC 30.9 30.8  RDW 15.8* 15.5  PLT 191 171   BNP Recent Labs  Lab 10/22/19 1528  BNP 247.0*    DDimer No results for input(s): DDIMER in the last 168 hours.   Radiology/Studies:  Dg Chest Port 1 View  Result Date: 10/22/2019 CLINICAL DATA:  Shortness of breath EXAM: PORTABLE CHEST 1 VIEW COMPARISON:  None. FINDINGS: Lungs are clear. There is cardiomegaly with pulmonary vascularity within normal limits. No adenopathy. No bone lesions. IMPRESSION: Cardiomegaly.  No edema or consolidation. Electronically Signed   By: Bretta BangWilliam  Woodruff III M.D.   On: 10/22/2019 15:52    Assessment and Plan:   1. Afib - new diagnosis for patient - started on dilt gtt, remains in rate controlled afib this AM - start lopressor 50mg  tid, wean dilt gtt.   - CHADS2Vasc score is difficult to assess as he has no pcp and limited chronic diagnoses. He does have clinical HF  and also history of HTN, score is at least 2 and thus anticoag is indicated - he was started on hep gtt, convert to oral eliquis 5mg  bid   2. Acute heart failure - echo  pending to further describe type of heart failure - likely exacerbated by afib with RVR. Risk for tachy mediated CM. With HTN not on home meds risk for HTN CM - start lasix 40mg  IV bid, f/u echo. If systolic dysfunction will need to adjust meds accordingly.      For questions or updates, please contact [facility] Please consult [email or web address] for contact info under     Signed, , MD  10/23/2019 8:31 AM

## 2019-10-23 NOTE — Progress Notes (Signed)
[place] for heparin Indication: atrial fibrillation  Allergies  Allergen Reactions  . Keflex [Cephalexin] Hives and Nausea And Vomiting    Patient Measurements: Height: 6' (182.9 cm) Weight: (!) 333 lb 8.9 oz (151.3 kg) IBW/kg (Calculated) : 77.6 Heparin Dosing Weight: 110 kg  Vital Signs: Temp: 98.4 F (36.9 C) (10/23 0030) Temp Source: Oral (10/23 0030) BP: 137/98 (10/23 0500) Pulse Rate: 103 (10/23 0500)  Labs: Recent Labs    10/22/19 1524 10/22/19 1528 10/23/19 0424  HGB  --  15.8 16.1  HCT  --  51.1 52.3*  PLT  --  191 171  APTT 30  --   --   LABPROT 14.6  --   --   INR 1.2  --   --   HEPARINUNFRC  --   --  0.16*  CREATININE  --  1.06  --     Estimated Creatinine Clearance: 110.9 mL/min (by C-G formula based on SCr of 1.06 mg/dL).   Medical History: Past Medical History:  Diagnosis Date  . COPD (chronic obstructive pulmonary disease) ([place])   . Eczema   . Hypertension     Medications:  See electronic med rec  Assessment: 63 y.o. M presents with afib with RVR. To begin heparin gtt. No AC PTA. CBC ok at baseline.  Heparin level subtherapeutic (0.16) on gtt at 1600 units/hr. No issues with line or bleeding reported per RN. CBC stable.  Goal of Therapy:  Heparin level 0.3-0.7 units/ml Monitor platelets by anticoagulation protocol: Yes   Plan:  Rebolus heparin 3000 units Increase heparin gtt at 1950 units/hr Will f/u heparin level in 6 hours Daily heparin level and CBC  Sherlon Handing, PharmD, BCPS 10/23/2019,5:43 AM

## 2019-10-23 NOTE — Progress Notes (Signed)
*  PRELIMINARY RESULTS* Echocardiogram 2D Echocardiogram has been performed.  Leavy Cella 10/23/2019, 12:10 PM

## 2019-10-23 NOTE — TOC Initial Note (Signed)
Transition of Care [facility]) - Initial/Assessment Note    Patient Details  Name: Adam Marquez MRN: [id number] Date of Birth: [birth year]  Transition of [facility]) CM/SW Contact:    Shade Flood, LCSW Phone Number: 10/23/2019, 1:07 PM  Clinical Narrative:                  Pt admitted from home. Received consult due to pt needing PCP and being discharged on Eliquis. Pt does not have insurance.  Met with pt to assess. Pt reports that he is independent in ADLs at home. He is agreeable to follow up at [facility]. Appointment made for Friday 10/30 at 2:00pm.   Provided pt with information and application for care at the clinic. Also provided pt with 30 day free coupon for Eliquis. Pt will likely need MATCH voucher for other medications at dc.  Pt states that he does not drive but that he has someone who can take him to appointments and to get his medications.  MD indicated pt would remain hospitalized through the weekend. TOC will follow up Monday. Expected Discharge Plan: Home/Self Care Barriers to Discharge: Continued Medical Work up   Patient Goals and CMS Choice Patient states their goals for this hospitalization and ongoing recovery are:: Return home      Expected Discharge Plan and Services Expected Discharge Plan: Home/Self Care In-house Referral: Clinical Social Work     Living arrangements for the past 2 months: Single Family Home                                      Prior Living Arrangements/Services Living arrangements for the past 2 months: Single Family Home Lives with:: Self Patient language and need for interpreter reviewed:: Yes Do you feel safe going back to the place where you live?: Yes      Need for Family Participation in Patient Care: No (Comment) Care giver support system in place?: Yes (comment)   Criminal Activity/Legal Involvement Pertinent to Current Situation/Hospitalization: No - Comment as needed  Activities of Daily  Living Home Assistive Devices/Equipment: Eyeglasses, Cane (specify quad or straight) ADL Screening (condition at time of admission) Patient's cognitive ability adequate to safely complete daily activities?: Yes Is the patient deaf or have difficulty hearing?: No Does the patient have difficulty seeing, even when wearing glasses/contacts?: No Does the patient have difficulty concentrating, remembering, or making decisions?: No Patient able to express need for assistance with ADLs?: No Does the patient have difficulty dressing or bathing?: No Independently performs ADLs?: Yes (appropriate for developmental age) Does the patient have difficulty walking or climbing stairs?: Yes Weakness of Legs: Both Weakness of Arms/Hands: None  Permission Sought/Granted                  Emotional Assessment Appearance:: Appears stated age Attitude/Demeanor/Rapport: Engaged Affect (typically observed): Calm Orientation: : Oriented to Self, Oriented to Place, Oriented to  Time, Oriented to Situation Alcohol / Substance Use: Not Applicable Psych Involvement: No (comment)  Admission diagnosis:  Atrial fibrillation with RVR ([place]) [I48.91] Bilateral lower extremity edema [R60.0] Patient Active Problem List   Diagnosis Date Noted  . Lymphedema 10/23/2019  . Acute CHF (congestive heart failure) ([place]) 10/23/2019  . Obesity, Class III, BMI 40-49.9 (morbid obesity) ([place]) 10/23/2019  . Atrial fibrillation with RVR ([place]) 10/22/2019  . Hypertension   . COPD (chronic obstructive pulmonary disease) ([place])   . Tobacco  use    PCP:  Patient, No Pcp Per Pharmacy:   [facility] [phone] - [place], [place] AT [place] [address] [place] [address] Phone: [phone] Fax: [phone]     Social Determinants of Health (SDOH) Interventions    Readmission Risk Interventions Readmission Risk Prevention Plan 10/23/2019  Post Dischage Appt Complete   Medication Screening Complete  Transportation Screening Complete

## 2019-10-24 DIAGNOSIS — R600 Localized edema: Secondary | ICD-10-CM

## 2019-10-24 DIAGNOSIS — I5031 Acute diastolic (congestive) heart failure: Secondary | ICD-10-CM

## 2019-10-24 LAB — BASIC METABOLIC PANEL
Anion gap: 11 (ref 5–15)
BUN: 19 mg/dL (ref 8–23)
CO2: 32 mmol/L (ref 22–32)
Calcium: 9.1 mg/dL (ref 8.9–10.3)
Chloride: 96 mmol/L — ABNORMAL LOW (ref 98–111)
Creatinine, Ser: 1.02 mg/dL (ref 0.61–1.24)
GFR calc Af Amer: >60 mL/min (ref >60)
GFR calc non Af Amer: >60 mL/min (ref >60)
Glucose, Bld: 133 mg/dL — ABNORMAL HIGH (ref 70–99)
Potassium: 4.2 mmol/L (ref 3.5–5.1)
Sodium: 139 mmol/L (ref 135–145)

## 2019-10-24 LAB — LIPID PANEL
Cholesterol: 103 mg/dL (ref 0–200)
HDL: 29 mg/dL — ABNORMAL LOW (ref >40)
LDL Cholesterol: 62 mg/dL (ref 0–99)
Total CHOL/HDL Ratio: 3.6 RATIO
Triglycerides: 59 mg/dL (ref <150)
VLDL: 12 mg/dL (ref 0–40)

## 2019-10-24 LAB — CBC
HCT: 47.4 % (ref 39.0–52.0)
Hemoglobin: 14.9 g/dL (ref 13.0–17.0)
MCH: 32.3 pg (ref 26.0–34.0)
MCHC: 31.4 g/dL (ref 30.0–36.0)
MCV: 102.6 fL — ABNORMAL HIGH (ref 80.0–100.0)
Platelets: 193 10*3/uL (ref 150–400)
RBC: 4.62 MIL/uL (ref 4.22–5.81)
RDW: 15.2 % (ref 11.5–15.5)
WBC: 8.2 10*3/uL (ref 4.0–10.5)
nRBC: 0 % (ref 0.0–0.2)

## 2019-10-24 LAB — MAGNESIUM: Magnesium: 2.1 mg/dL (ref 1.7–2.4)

## 2019-10-24 MED ORDER — DILTIAZEM HCL 25 MG/5ML IV SOLN
20.0000 mg | Freq: Once | INTRAVENOUS | Status: AC
  Administered 2019-10-24: 20 mg via INTRAVENOUS
  Filled 2019-10-24: qty 5

## 2019-10-24 MED ORDER — DILTIAZEM HCL 30 MG PO TABS
30.0000 mg | ORAL_TABLET | Freq: Four times a day (QID) | ORAL | Status: DC
  Administered 2019-10-24 – 2019-10-25 (×2): 30 mg via ORAL
  Filled 2019-10-24 (×2): qty 1

## 2019-10-24 NOTE — Progress Notes (Signed)
PROGRESS NOTE  Adam Marquez ZOX:[id number] DOB: 01/15/1956 DOA: 10/22/2019 PCP: Patient, No Pcp Per  Brief History:  63 year old male with a history of hypertension, morbid obesity, tobacco abuse, and suspected COPD presenting with progressive shortness of breath that was significantly worsened over the past 2 days.  In addition, he is concerned about increasing lower extremity edema and erythema of his bilateral lower extremities for the past 2 days.  The patient has not been seen a physician in approximately 1 year since he moved to [place] from [place].  He states that he has not been on any medications but previously was on antihypertensive medications.  He continues to smoke 1/2 pack/day but previously smoked 1-1/2 packs x 30 years.  The patient also describes orthopnea type symptoms requiring him to sleep sitting upright.  He denies any fevers, chills, chest pain, coughing, hemoptysis, nausea, vomiting, direct abdominal pain. In the emergency department, the patient was afebrile and hemodynamically stable with oxygen saturations of 93% on 2 L.  BMP and CBC were essentially unremarkable.  BNP was 247.  Chest x-ray showed cardiomegaly with mild increased interstitial markings.  EKG showed atrial fibrillation with nonspecific T wave changes.  Assessment/Plan: Acute diastolic CHF -Echocardiogram--EF 55-60%, dilated IVC -Continue IV furosemide 40 mg IV twice daily -Accurate I's and O's--3.2L out in last 24 hours -remains clinically fluid overloaded  Atrial fibrillation with RVR, type unspecified -New diagnosis -Continue diltiazem drip>>>po metoprolol -Started apixaban -Discontinue IV heparin -TSH--2.139 -personally reviewed EKG--afib, nonspecific T wave changes -personally reviewed CXR--cardiomegaly, increase interstitial marking -CHADSVASc at least 2  Tobacco abuse I have discussed tobacco cessation with the patient.  I have counseled the patient regarding  the negative impacts of continued tobacco use including but not limited to lung cancer, COPD, and cardiovascular disease.  I have discussed alternatives to tobacco and modalities that may help facilitate tobacco cessation including but not limited to biofeedback, hypnosis, and medications.  Total time spent with tobacco counseling was 4 minutes. -Patient has a least 45-pack-year history -start xopenex and atroven  Morbid obesity -BMI 45.24  Essential hypertension -improving with metoprolol and diuresis  Lower extremity edema -Venous duplex--neg  ??  Cellulitis lower extremity -continue empiric clindamycin    Disposition Plan:   Home in 2-3 days  Family Communication:   No Family at bedside  Consultants:  cardiology  Code Status:  FULL   DVT Prophylaxis:  apixaban   Procedures: As Listed in Progress Note Above  Antibiotics: cefazolin    Subjective: Pt states he remains sob.  Denies f/c, cp, n/v/d, abd pain.  He continues to have dyspnea on exertion and orthopnea type symptoms  Objective: Vitals:   10/24/19 1100 10/24/19 1118 10/24/19 1200 10/24/19 1209  BP: 125/87  (!) 119/96   Pulse: 85 75 (!) 113   Resp: Temp:  (!) 97.5 F (36.4 C)    TempSrc:  Oral    SpO2: 92% 94% 91% 93%  Weight:      Height:        Intake/Output Summary (Last 24 hours) at 10/24/2019 1314 Last data filed at 10/24/2019 1200 Gross per 24 hour  Intake 687.97 ml  Output 3450 ml  Net -2762.03 ml   Weight change: 6.724 kg Exam:   General:  Pt is alert, follows commands appropriately, not in acute distress  HEENT: No icterus, No thrush, No neck mass, [place]/AT  Cardiovascular: IRRR, S1/S2, no rubs,  no gallops  Respiratory: bibasilar crackles, no wheeze  Abdomen: Soft/+BS, non tender, non distended, no guarding  Extremities: 2 + LE edema, No lymphangitis, No petechiae, No rashes, no synovitis   Data Reviewed: I have personally reviewed following labs and  imaging studies Basic Metabolic Panel: Recent Labs  Lab 10/22/19 1524 10/22/19 1528 10/24/19 0402  NA  --  138 139  K  --  4.2 4.2  CL  --  97* 96*  CO2  --  32 32  GLUCOSE  --  101* 133*  BUN  --  10 19  CREATININE  --  1.06 1.02  CALCIUM  --  9.0 9.1  MG 2.1  --  2.1  PHOS 3.2  --   --    Liver Function Tests: Recent Labs  Lab 10/22/19 1528  AST 19  ALT 20  ALKPHOS 84  BILITOT 1.1  PROT 6.3*  ALBUMIN 3.2*   No results for input(s): LIPASE, AMYLASE in the last 168 hours. No results for input(s): AMMONIA in the last 168 hours. Coagulation Profile: Recent Labs  Lab 10/22/19 1524  INR 1.2   CBC: Recent Labs  Lab 10/22/19 1528 10/23/19 0424 10/24/19 0402  WBC 6.7 6.8 8.2  NEUTROABS 4.4  --   --   HGB 15.8 16.1 14.9  HCT 51.1 52.3* 47.4  MCV 103.0* 102.8* 102.6*  PLT 191 171 193   Cardiac Enzymes: No results for input(s): CKTOTAL, CKMB, CKMBINDEX, TROPONINI in the last 168 hours. BNP: Invalid input(s): POCBNP CBG: No results for input(s): GLUCAP in the last 168 hours. HbA1C: Recent Labs    10/23/19 0915  HGBA1C 5.9*   Urine analysis:    Component Value Date/Time   COLORURINE YELLOW 10/22/2019 2237   APPEARANCEUR CLEAR 10/22/2019 2237   LABSPEC 1.017 10/22/2019 2237   PHURINE 6.0 10/22/2019 2237   GLUCOSEU NEGATIVE 10/22/2019 2237   HGBUR NEGATIVE 10/22/2019 2237   BILIRUBINUR NEGATIVE 10/22/2019 2237   KETONESUR 5 (A) 10/22/2019 2237   PROTEINUR 30 (A) 10/22/2019 2237   NITRITE NEGATIVE 10/22/2019 2237   LEUKOCYTESUR NEGATIVE 10/22/2019 2237   Sepsis Labs: @LABRCNTIP (procalcitonin:4,lacticidven:4) ) Recent Results (from the past 240 hour(s))  SARS CORONAVIRUS 2 (Tayler Heiden 6-24 HRS) Nasopharyngeal Nasopharyngeal Swab     Status: None   Collection Time: 10/22/19  5:00 PM   Specimen: Nasopharyngeal Swab  Result Value Ref Range Status   SARS Coronavirus 2 NEGATIVE NEGATIVE Final    Comment: (NOTE) SARS-CoV-2 target nucleic acids are NOT  DETECTED. The SARS-CoV-2 RNA is generally detectable in upper and lower respiratory specimens during the acute phase of infection. Negative results do not preclude SARS-CoV-2 infection, do not rule out co-infections with other pathogens, and should not be used as the sole basis for treatment or other patient management decisions. Negative results must be combined with clinical observations, patient history, and epidemiological information. The expected result is Negative. Fact Sheet for Patients: SugarRoll.be Fact Sheet for Healthcare Providers: https://www.woods-mathews.com/ This test is not yet approved or cleared by the [place] FDA and  has been authorized for detection and/or diagnosis of SARS-CoV-2 by FDA under an Emergency Use Authorization (EUA). This EUA will remain  in effect (meaning this test can be used) for the duration of the COVID-19 declaration under Section 56 4(b)(1) of the Act, 21 U.S.C. section 360bbb-3(b)(1), unless the authorization is terminated or revoked sooner. Performed at [facility], [place] [address], [place], [place] [address]   MRSA PCR Screening  Status: None   Collection Time: 10/23/19 12:31 AM   Specimen: Nasal Mucosa; Nasopharyngeal  Result Value Ref Range Status   MRSA by PCR NEGATIVE NEGATIVE Final    Comment:        The GeneXpert MRSA Assay (FDA approved for NASAL specimens only), is one component of a comprehensive MRSA colonization surveillance program. It is not intended to diagnose MRSA infection nor to guide or monitor treatment for MRSA infections. Performed at [facility], [address] [place], [place] [address]      Scheduled Meds:  apixaban  5 mg Oral BID   Chlorhexidine Gluconate Cloth  6 each Topical Daily   furosemide  40 mg Intravenous BID   metoprolol tartrate  50 mg Oral TID   nicotine  14 mg Transdermal Daily   Continuous Infusions:  sodium chloride  10 mL/hr at 10/24/19 1200   clindamycin (CLEOCIN) IV Stopped (10/24/19 0650)   diltiazem (CARDIZEM) infusion Stopped (10/23/19 1445)    Procedures/Studies: Koreas Venous Img Lower Bilateral  Result Date: 10/23/2019 CLINICAL DATA:  Chronic bilateral lower extremity pain and edema, worse during the past 1-2 weeks. EXAM: BILATERAL LOWER EXTREMITY VENOUS DOPPLER ULTRASOUND TECHNIQUE: Gray-scale sonography with graded compression, as well as color Doppler and duplex ultrasound were performed to evaluate the lower extremity deep venous systems from the level of the common femoral vein and including the common femoral, femoral, profunda femoral, popliteal and calf veins including the posterior tibial, peroneal and gastrocnemius veins when visible. The superficial great saphenous vein was also interrogated. Spectral Doppler was utilized to evaluate flow at rest and with distal augmentation maneuvers in the common femoral, femoral and popliteal veins. COMPARISON:  None. FINDINGS: Examination is degraded due to patient body habitus and poor sonographic window. RIGHT LOWER EXTREMITY Common Femoral Vein: No evidence of thrombus. Normal compressibility, respiratory phasicity and response to augmentation. Saphenofemoral Junction: No evidence of thrombus. Normal compressibility and flow on color Doppler imaging. Profunda Femoral Vein: No evidence of thrombus. Normal compressibility and flow on color Doppler imaging. Femoral Vein: No evidence of thrombus. Normal compressibility, respiratory phasicity and response to augmentation. Popliteal Vein: No evidence of thrombus. Normal compressibility, respiratory phasicity and response to augmentation. Calf Veins: No evidence of thrombus. Normal compressibility and flow on color Doppler imaging. Superficial Great Saphenous Vein: No evidence of thrombus. Normal compressibility. Venous Reflux:  None. Other Findings:  None. LEFT LOWER EXTREMITY Common Femoral Vein: No evidence of  thrombus. Normal compressibility, respiratory phasicity and response to augmentation. Saphenofemoral Junction: No evidence of thrombus. Normal compressibility and flow on color Doppler imaging. Profunda Femoral Vein: No evidence of thrombus. Normal compressibility and flow on color Doppler imaging. Femoral Vein: No evidence of thrombus. Normal compressibility, respiratory phasicity and response to augmentation. Popliteal Vein: No evidence of thrombus. Normal compressibility, respiratory phasicity and response to augmentation. Calf Veins: Appear patent where imaged. Superficial Great Saphenous Vein: No evidence of thrombus. Normal compressibility. Venous Reflux:  None. Other Findings:  None. IMPRESSION: No evidence of DVT within either lower extremity. Electronically Signed   By: Simonne ComeJohn  Watts M.D.   On: 10/23/2019 13:02   Dg Chest Port 1 View  Result Date: 10/22/2019 CLINICAL DATA:  Shortness of breath EXAM: PORTABLE CHEST 1 VIEW COMPARISON:  None. FINDINGS: Lungs are clear. There is cardiomegaly with pulmonary vascularity within normal limits. No adenopathy. No bone lesions. IMPRESSION: Cardiomegaly.  No edema or consolidation. Electronically Signed   By: Bretta BangWilliam  Woodruff III M.D.   On: 10/22/2019 15:52  Catarina Hartshorn, DO  [facility] Pager [phone]  If 7PM-7AM, please contact night-coverage [email or web address] Password [email or web address] 10/24/2019, 1:14 PM   LOS: 2 days

## 2019-10-25 MED ORDER — LEVALBUTEROL HCL 0.63 MG/3ML IN NEBU
0.6300 mg | INHALATION_SOLUTION | Freq: Three times a day (TID) | RESPIRATORY_TRACT | Status: DC
  Administered 2019-10-25: 0.63 mg via RESPIRATORY_TRACT
  Filled 2019-10-25: qty 3

## 2019-10-25 MED ORDER — IPRATROPIUM BROMIDE 0.02 % IN SOLN
0.5000 mg | Freq: Three times a day (TID) | RESPIRATORY_TRACT | Status: DC
  Administered 2019-10-25: 0.5 mg via RESPIRATORY_TRACT
  Filled 2019-10-25: qty 2.5

## 2019-10-25 MED ORDER — METOPROLOL TARTRATE 50 MG PO TABS
50.0000 mg | ORAL_TABLET | Freq: Two times a day (BID) | ORAL | Status: DC
  Administered 2019-10-25 – 2019-10-26 (×2): 50 mg via ORAL
  Filled 2019-10-25 (×2): qty 1

## 2019-10-25 MED ORDER — IPRATROPIUM BROMIDE 0.02 % IN SOLN
0.5000 mg | Freq: Three times a day (TID) | RESPIRATORY_TRACT | Status: DC
  Administered 2019-10-26 – 2019-10-30 (×13): 0.5 mg via RESPIRATORY_TRACT
  Filled 2019-10-25 (×13): qty 2.5

## 2019-10-25 MED ORDER — TERBINAFINE HCL 1 % EX CREA
TOPICAL_CREAM | Freq: Two times a day (BID) | CUTANEOUS | Status: DC
  Administered 2019-10-26: 1 via TOPICAL
  Administered 2019-10-26 – 2019-10-28 (×4): via TOPICAL
  Administered 2019-10-28: 1 via TOPICAL
  Administered 2019-10-29 – 2019-10-30 (×3): via TOPICAL
  Filled 2019-10-25: qty 12

## 2019-10-25 MED ORDER — LEVALBUTEROL HCL 0.63 MG/3ML IN NEBU
0.6300 mg | INHALATION_SOLUTION | Freq: Three times a day (TID) | RESPIRATORY_TRACT | Status: DC
  Administered 2019-10-26 – 2019-10-30 (×12): 0.63 mg via RESPIRATORY_TRACT
  Filled 2019-10-25 (×13): qty 3

## 2019-10-25 NOTE — Progress Notes (Signed)
PROGRESS NOTE  Adam Marquez ZOX:[id number] DOB: 10-01-1956 DOA: 10/22/2019 PCP: Patient, No Pcp Per  Brief History: 63 year old male with a history of hypertension, morbid obesity, tobacco abuse, and suspected COPD presenting with progressive shortness of breath that was significantly worsened over the past 2 days. In addition, he is concerned about increasing lower extremity edema and erythema of his bilateral lower extremities for the past 2 days. The patient has not been seen a physician in approximately 1 year since he moved to [place] from [place]. He states that he has not been on any medications but previously was on antihypertensive medications.He continues to smoke 1/2 pack/day but previously smoked 1-1/2 packs x 30 years. The patient also describes orthopnea type symptoms requiring him to sleep sitting upright. He denies any fevers, chills, chest pain, coughing, hemoptysis, nausea, vomiting, direct abdominal pain. In the emergency department, the patient was afebrile and hemodynamically stable with oxygen saturations of 93% on 2 L. BMP and CBC were essentially unremarkable. BNP was 247. Chest x-ray showed cardiomegaly with mild increased interstitial markings. EKG showed atrial fibrillation with nonspecific T wave changes.  Assessment/Plan: Acute diastolic CHF -Echocardiogram--EF 55-60%, dilated IVC -Continue IV furosemide 40 mg IV twice daily -Accurate I's and O's--NEG 5.6L -remains clinically fluid overloaded  Atrial fibrillation with RVR, type unspecified -New diagnosis -Continue diltiazem drip>>>po metoprolol -10/25--pt developed bradycardia into 30s with starting diltiazem -d/c diltiazem and decrease metoprolol to bid -Continue apixaban -Discontinue IV heparin -TSH--2.139 -personally reviewed EKG--afib, nonspecific T wave changes -CHADSVASc at least 2  Tobacco abuse I have discussed tobacco cessation with the patient. I have  counseled the patient regarding the negative impacts of continued tobacco use including but not limited to lung cancer, COPD, and cardiovascular disease. I have discussed alternatives to tobacco and modalities that may help facilitate tobacco cessation including but not limited to biofeedback, hypnosis, and medications. Total time spent with tobacco counseling was 4 minutes. -Patient has a least 45-pack-year history -start xopenex, atrovent  Morbid obesity -BMI 45.24  Essential hypertension -improving with metoprolol and diuresis  Lower extremity edema -Venous duplex--neg  ??Cellulitis lower extremity -continue empiricclindamycin  Tinea cruris -start terbinafine cream    Disposition Plan: Home in 2-3days  Family Communication:NoFamily at bedside  Consultants:cardiology  Code Status: FULL   DVT Prophylaxis:apixaban   Procedures: As Listed in Progress Note Above  Antibiotics: clinda IV 10/23>>>    Subjective: Pt feels dyspnea on exertion and leg edema are improving, but he still has orthopnea.  Feels leg pain is improving.  Denies f/c, cp, n/v/d, abd pain, dysuria, hematochezia, melena  Objective: Vitals:   10/25/19 1400 10/25/19 1500 10/25/19 1600 10/25/19 1633  BP: (!) 149/94 (!) 133/100 112/71   Pulse:  85 97 80  Resp: Temp:    98.4 F (36.9 C)  TempSrc:    Oral  SpO2:  96% 97% 97%  Weight:      Height:        Intake/Output Summary (Last 24 hours) at 10/25/2019 1654 Last data filed at 10/25/2019 1600 Gross per 24 hour  Intake 1112.09 ml  Output 4150 ml  Net -3037.91 ml   Weight change: -4.3 kg Exam:   General:  Pt is alert, follows commands appropriately, not in acute distress  HEENT: No icterus, No thrush, No neck mass, [place]/AT  Cardiovascular: IRRR, S1/S2, no rubs, no gallops  Respiratory: bibasilar crackes, no wheeze  Abdomen: Soft/+BS, non  tender, non distended, no guarding  Extremities: 2+ LE  edema, No lymphangitis, No petechiae, No rashes, no synovitis   Data Reviewed: I have personally reviewed following labs and imaging studies Basic Metabolic Panel: Recent Labs  Lab 10/22/19 1524 10/22/19 1528 10/24/19 0402  NA  --  138 139  K  --  4.2 4.2  CL  --  97* 96*  CO2  --  32 32  GLUCOSE  --  101* 133*  BUN  --  10 19  CREATININE  --  1.06 1.02  CALCIUM  --  9.0 9.1  MG 2.1  --  2.1  PHOS 3.2  --   --    Liver Function Tests: Recent Labs  Lab 10/22/19 1528  AST 19  ALT 20  ALKPHOS 84  BILITOT 1.1  PROT 6.3*  ALBUMIN 3.2*   No results for input(s): LIPASE, AMYLASE in the last 168 hours. No results for input(s): AMMONIA in the last 168 hours. Coagulation Profile: Recent Labs  Lab 10/22/19 1524  INR 1.2   CBC: Recent Labs  Lab 10/22/19 1528 10/23/19 0424 10/24/19 0402  WBC 6.7 6.8 8.2  NEUTROABS 4.4  --   --   HGB 15.8 16.1 14.9  HCT 51.1 52.3* 47.4  MCV 103.0* 102.8* 102.6*  PLT 191 171 193   Cardiac Enzymes: No results for input(s): CKTOTAL, CKMB, CKMBINDEX, TROPONINI in the last 168 hours. BNP: Invalid input(s): POCBNP CBG: No results for input(s): GLUCAP in the last 168 hours. HbA1C: Recent Labs    10/23/19 0915  HGBA1C 5.9*   Urine analysis:    Component Value Date/Time   COLORURINE YELLOW 10/22/2019 2237   APPEARANCEUR CLEAR 10/22/2019 2237   LABSPEC 1.017 10/22/2019 2237   PHURINE 6.0 10/22/2019 2237   GLUCOSEU NEGATIVE 10/22/2019 2237   HGBUR NEGATIVE 10/22/2019 2237   BILIRUBINUR NEGATIVE 10/22/2019 2237   KETONESUR 5 (A) 10/22/2019 2237   PROTEINUR 30 (A) 10/22/2019 2237   NITRITE NEGATIVE 10/22/2019 2237   LEUKOCYTESUR NEGATIVE 10/22/2019 2237   Sepsis Labs: @LABRCNTIP (procalcitonin:4,lacticidven:4) ) Recent Results (from the past 240 hour(s))  SARS CORONAVIRUS 2 ([place] 6-24 HRS) Nasopharyngeal Nasopharyngeal Swab     Status: None   Collection Time: 10/22/19  5:00 PM   Specimen: Nasopharyngeal Swab  Result Value  Ref Range Status   SARS Coronavirus 2 NEGATIVE NEGATIVE Final    Comment: (NOTE) SARS-CoV-2 target nucleic acids are NOT DETECTED. The SARS-CoV-2 RNA is generally detectable in upper and lower respiratory specimens during the acute phase of infection. Negative results do not preclude SARS-CoV-2 infection, do not rule out co-infections with other pathogens, and should not be used as the sole basis for treatment or other patient management decisions. Negative results must be combined with clinical observations, patient history, and epidemiological information. The expected result is Negative. Fact Sheet for Patients: 10/24/19 Fact Sheet for Healthcare Providers: HairSlick.no This test is not yet approved or cleared by the [place]ir[place]ir.com FDA and  has been authorized for detection and/or diagnosis of SARS-CoV-2 by FDA under an Emergency Use Authorization (EUA). This EUA will remain  in effect (meaning this test can be used) for the duration of the COVID-19 declaration under Section 56 4(b)(1) of the Act, 21 U.S.C. section 360bbb-3(b)(1), unless the authorization is terminated or revoked sooner. Performed at [facility], [address], [place], [place]   MRSA PCR Screening     Status: None   Collection Time: 10/23/19 12:31 AM   Specimen:  Nasal Mucosa; Nasopharyngeal  Result Value Ref Range Status   MRSA by PCR NEGATIVE NEGATIVE Final    Comment:        The GeneXpert MRSA Assay (FDA approved for NASAL specimens only), is one component of a comprehensive MRSA colonization surveillance program. It is not intended to diagnose MRSA infection nor to guide or monitor treatment for MRSA infections. Performed at [facility], [address] [place], [place] [address]      Scheduled Meds:  apixaban  5 mg Oral BID   Chlorhexidine Gluconate Cloth  6 each Topical Daily   furosemide  40 mg Intravenous BID     metoprolol tartrate  50 mg Oral BID   nicotine  14 mg Transdermal Daily   Continuous Infusions:  sodium chloride 10 mL/hr at 10/25/19 1600   clindamycin (CLEOCIN) IV Stopped (10/25/19 1405)    Procedures/Studies: Koreas Venous Img Lower Bilateral  Result Date: 10/23/2019 CLINICAL DATA:  Chronic bilateral lower extremity pain and edema, worse during the past 1-2 weeks. EXAM: BILATERAL LOWER EXTREMITY VENOUS DOPPLER ULTRASOUND TECHNIQUE: Gray-scale sonography with graded compression, as well as color Doppler and duplex ultrasound were performed to evaluate the lower extremity deep venous systems from the level of the common femoral vein and including the common femoral, femoral, profunda femoral, popliteal and calf veins including the posterior tibial, peroneal and gastrocnemius veins when visible. The superficial great saphenous vein was also interrogated. Spectral Doppler was utilized to evaluate flow at rest and with distal augmentation maneuvers in the common femoral, femoral and popliteal veins. COMPARISON:  None. FINDINGS: Examination is degraded due to patient body habitus and poor sonographic window. RIGHT LOWER EXTREMITY Common Femoral Vein: No evidence of thrombus. Normal compressibility, respiratory phasicity and response to augmentation. Saphenofemoral Junction: No evidence of thrombus. Normal compressibility and flow on color Doppler imaging. Profunda Femoral Vein: No evidence of thrombus. Normal compressibility and flow on color Doppler imaging. Femoral Vein: No evidence of thrombus. Normal compressibility, respiratory phasicity and response to augmentation. Popliteal Vein: No evidence of thrombus. Normal compressibility, respiratory phasicity and response to augmentation. Calf Veins: No evidence of thrombus. Normal compressibility and flow on color Doppler imaging. Superficial Great Saphenous Vein: No evidence of thrombus. Normal compressibility. Venous Reflux:  None. Other Findings:   None. LEFT LOWER EXTREMITY Common Femoral Vein: No evidence of thrombus. Normal compressibility, respiratory phasicity and response to augmentation. Saphenofemoral Junction: No evidence of thrombus. Normal compressibility and flow on color Doppler imaging. Profunda Femoral Vein: No evidence of thrombus. Normal compressibility and flow on color Doppler imaging. Femoral Vein: No evidence of thrombus. Normal compressibility, respiratory phasicity and response to augmentation. Popliteal Vein: No evidence of thrombus. Normal compressibility, respiratory phasicity and response to augmentation. Calf Veins: Appear patent where imaged. Superficial Great Saphenous Vein: No evidence of thrombus. Normal compressibility. Venous Reflux:  None. Other Findings:  None. IMPRESSION: No evidence of DVT within either lower extremity. Electronically Signed   By: Simonne ComeJohn  Watts M.D.   On: 10/23/2019 13:02   Dg Chest Port 1 View  Result Date: 10/22/2019 CLINICAL DATA:  Shortness of breath EXAM: PORTABLE CHEST 1 VIEW COMPARISON:  None. FINDINGS: Lungs are clear. There is cardiomegaly with pulmonary vascularity within normal limits. No adenopathy. No bone lesions. IMPRESSION: Cardiomegaly.  No edema or consolidation. Electronically Signed   By: Bretta BangWilliam  Woodruff III M.D.   On: 10/22/2019 15:52    Adam Hartshornavid Corinthian Mizrahi, DO  [facility] Pager [phone]  If 7PM-7AM, please contact night-coverage [email or web address] Password [email or web address] 10/25/2019, 4:54  PM   LOS: 3 days

## 2019-10-25 NOTE — Progress Notes (Addendum)
HR dropping to the 30s. Patient asymptomatic. Notified Tat, MD. See new orders.

## 2019-10-26 DIAGNOSIS — J449 Chronic obstructive pulmonary disease, unspecified: Secondary | ICD-10-CM

## 2019-10-26 DIAGNOSIS — I890 Lymphedema, not elsewhere classified: Secondary | ICD-10-CM

## 2019-10-26 DIAGNOSIS — G4733 Obstructive sleep apnea (adult) (pediatric): Secondary | ICD-10-CM

## 2019-10-26 DIAGNOSIS — I5031 Acute diastolic (congestive) heart failure: Secondary | ICD-10-CM

## 2019-10-26 DIAGNOSIS — Z720 Tobacco use: Secondary | ICD-10-CM

## 2019-10-26 DIAGNOSIS — I10 Essential (primary) hypertension: Secondary | ICD-10-CM

## 2019-10-26 DIAGNOSIS — R600 Localized edema: Secondary | ICD-10-CM

## 2019-10-26 LAB — BASIC METABOLIC PANEL
Anion gap: 14 (ref 5–15)
BUN: 21 mg/dL (ref 8–23)
CO2: 33 mmol/L — ABNORMAL HIGH (ref 22–32)
Calcium: 9 mg/dL (ref 8.9–10.3)
Chloride: 93 mmol/L — ABNORMAL LOW (ref 98–111)
Creatinine, Ser: 0.95 mg/dL (ref 0.61–1.24)
GFR calc Af Amer: >60 mL/min (ref >60)
GFR calc non Af Amer: >60 mL/min (ref >60)
Glucose, Bld: 104 mg/dL — ABNORMAL HIGH (ref 70–99)
Potassium: 3.5 mmol/L (ref 3.5–5.1)
Sodium: 140 mmol/L (ref 135–145)

## 2019-10-26 LAB — CBC
HCT: 48.9 % (ref 39.0–52.0)
Hemoglobin: 15.4 g/dL (ref 13.0–17.0)
MCH: 32.1 pg (ref 26.0–34.0)
MCHC: 31.5 g/dL (ref 30.0–36.0)
MCV: 101.9 fL — ABNORMAL HIGH (ref 80.0–100.0)
Platelets: 202 10*3/uL (ref 150–400)
RBC: 4.8 MIL/uL (ref 4.22–5.81)
RDW: 15.2 % (ref 11.5–15.5)
WBC: 6.6 10*3/uL (ref 4.0–10.5)
nRBC: 0 % (ref 0.0–0.2)

## 2019-10-26 LAB — MAGNESIUM: Magnesium: 2 mg/dL (ref 1.7–2.4)

## 2019-10-26 MED ORDER — FUROSEMIDE 40 MG PO TABS
40.0000 mg | ORAL_TABLET | Freq: Two times a day (BID) | ORAL | Status: DC
  Administered 2019-10-26 – 2019-10-30 (×8): 40 mg via ORAL
  Filled 2019-10-26 (×8): qty 1

## 2019-10-26 MED ORDER — POTASSIUM CHLORIDE CRYS ER 20 MEQ PO TBCR
40.0000 meq | EXTENDED_RELEASE_TABLET | Freq: Once | ORAL | Status: AC
  Administered 2019-10-26: 40 meq via ORAL
  Filled 2019-10-26: qty 2

## 2019-10-26 MED ORDER — DILTIAZEM HCL 30 MG PO TABS
30.0000 mg | ORAL_TABLET | Freq: Four times a day (QID) | ORAL | Status: DC
  Administered 2019-10-26: 14:00:00 30 mg via ORAL
  Filled 2019-10-26: qty 1

## 2019-10-26 MED ORDER — DILTIAZEM HCL 30 MG PO TABS: 30.0000 mg | ORAL_TABLET | Freq: Four times a day (QID) | ORAL | Status: DC

## 2019-10-26 MED ORDER — DILTIAZEM HCL 25 MG/5ML IV SOLN
5.0000 mg | Freq: Once | INTRAVENOUS | Status: AC
  Administered 2019-10-26: 5 mg via INTRAVENOUS
  Filled 2019-10-26: qty 5

## 2019-10-26 MED ORDER — DILTIAZEM HCL 30 MG PO TABS
30.0000 mg | ORAL_TABLET | Freq: Four times a day (QID) | ORAL | Status: DC
  Administered 2019-10-26 – 2019-10-27 (×2): 30 mg via ORAL
  Filled 2019-10-26: qty 1

## 2019-10-26 NOTE — Progress Notes (Signed)
PTs HR elevated 130-140s at rest. Schedules Diltiazem PO given as ordered with no relief. Denies chest pain or discomfort. MD notified. Continue to monitor and await orders if appropriate.

## 2019-10-26 NOTE — Progress Notes (Signed)
PROGRESS NOTE  Adam Marquez ZOX:[id number] DOB: 03/12/1956 DOA: 10/22/2019 PCP: Patient, No Pcp Per  Brief History: 63 year old male with a history of hypertension, morbid obesity, tobacco abuse, and suspected COPD presenting with progressive shortness of breath that was significantly worsened over the past 2 days. In addition, he is concerned about increasing lower extremity edema and erythema of his bilateral lower extremities for the past 2 days. The patient has not been seen a physician in approximately 1 year since [place] moved to [place]. He states that he has not been on any medications but previously was on antihypertensive medications.He continues to smoke 1/2 pack/day but previously smoked 1-1/2 packs x 30 years. The patient also describes orthopnea type symptoms requiring him to sleep sitting upright. He denies any fevers, chills, chest pain, coughing, hemoptysis, nausea, vomiting, direct abdominal pain. In the emergency department, the patient was afebrile and hemodynamically stable with oxygen saturations of 93% on 2 L. BMP and CBC were essentially unremarkable. BNP was 247. Chest x-ray showed cardiomegaly with mild increased interstitial markings. EKG showed atrial fibrillation with nonspecific T wave changes.  Assessment/Plan: Acute diastolic CHF -Echocardiogram--EF 55-60%, dilated IVC -Continue IV furosemide 40 mg IV twice daily -Accurate I's and O's--NEG 5.6L -remains clinically fluid overloaded  Atrial fibrillation with RVR, type unspecified -New diagnosis -Off Cardizem drip, cardiologist discontinued metoprolol, they want to titrate p.o. Cardizem up, -Continue apixaban -Discontinued IV heparin -TSH--2.139 -CHADSVASc at least 2 -Significant tachycardia with new activity with heart rate in the 130s to 150s persist, -Difficulties with rate control due to bradycardia and bronchospasm--hence cardiology discontinued  metoprolol, -Continue to titrate Cardizem for rate control -Unable to discharge due to symptomatic tachycardia with minimal activity and further need for rate control/titration  Tobacco abuse/COPD -Smoking cessation advised, continue bronchodilators  Morbid obesity -BMI 45.24--this complicates overall care  Essential hypertension -Cardizem was ordered  Lower extremity edema -Venous duplex--neg  ??Cellulitis lower extremity -continue empiricclindamycin  Tinea cruris -start terbinafine cream    Disposition Plan: Home in 2-3days  --Unable to discharge due to symptomatic tachycardia with minimal activity and further need for rate control/titration  Family Communication:NoFamily at bedside  Consultants:cardiology  Code Status: FULL   DVT Prophylaxis:apixaban   Procedures: As Listed in Progress Note Above  Antibiotics: clinda IV 10/23>>>    Subjective: -Significant tachycardia with heart rate of 130s to 150s with minimal activity, occasional dizziness, some dyspnea on exertion, but O2 sats stayed above 93% on room air -Patient had bradycardia with metoprolol,  Objective: Vitals:   10/26/19 1556 10/26/19 1600 10/26/19 1700 10/26/19 1800  BP: (!) 123/93 116/78 108/83   Pulse: (!) 58 95 91 (!) 103  Resp: 14 11 14  (!) 21  Temp:  98.2 F (36.8 C)    TempSrc:  Oral    SpO2: 90% 93% 92% 96%  Weight:      Height:        Intake/Output Summary (Last 24 hours) at 10/26/2019 1918 Last data filed at 10/26/2019 1124 Gross per 24 hour  Intake --  Output 2750 ml  Net -2750 ml   Weight change: -5.3 kg Exam:   General:-Able to speak in sentences   HEENT: No icterus, No thrush, No neck mass, [place]/AT  Cardiovascular: IRRR, S1/S2, tachycardic  Respiratory: Improving air movement, faint bibasilar rales, no wheezing   abdomen: Soft/+BS, non tender, non distended, no guarding Extremities: 2+ LE edema, pedal pulses present  Data  Reviewed:  I have personally reviewed following labs and imaging studies Basic Metabolic Panel: Recent Labs  Lab 10/22/19 1524 10/22/19 1528 10/24/19 0402 10/26/19 0416  NA  --  138 139 140  K  --  4.2 4.2 3.5  CL  --  97* 96* 93*  CO2  --  32 32 33*  GLUCOSE  --  101* 133* 104*  BUN  --  CREATININE  --  1.06 1.02 0.95  CALCIUM  --  9.0 9.1 9.0  MG 2.1  --  2.1 2.0  PHOS 3.2  --   --   --    Liver Function Tests: Recent Labs  Lab 10/22/19 1528  AST 19  ALT 20  ALKPHOS 84  BILITOT 1.1  PROT 6.3*  ALBUMIN 3.2*   No results for input(s): LIPASE, AMYLASE in the last 168 hours. No results for input(s): AMMONIA in the last 168 hours. Coagulation Profile: Recent Labs  Lab 10/22/19 1524  INR 1.2   CBC: Recent Labs  Lab 10/22/19 1528 10/23/19 0424 10/24/19 0402 10/26/19 0416  WBC 6.7 6.8 8.2 6.6  NEUTROABS 4.4  --   --   --   HGB 15.8 16.1 14.9 15.4  HCT 51.1 52.3* 47.4 48.9  MCV 103.0* 102.8* 102.6* 101.9*  PLT 191 171 193 202   Cardiac Enzymes: No results for input(s): CKTOTAL, CKMB, CKMBINDEX, TROPONINI in the last 168 hours. BNP: Invalid input(s): POCBNP CBG: No results for input(s): GLUCAP in the last 168 hours. HbA1C: No results for input(s): HGBA1C in the last 72 hours. Urine analysis:    Component Value Date/Time   COLORURINE YELLOW 10/22/2019 2237   APPEARANCEUR CLEAR 10/22/2019 2237   LABSPEC 1.017 10/22/2019 2237   PHURINE 6.0 10/22/2019 2237   GLUCOSEU NEGATIVE 10/22/2019 2237   HGBUR NEGATIVE 10/22/2019 2237   BILIRUBINUR NEGATIVE 10/22/2019 2237   KETONESUR 5 (A) 10/22/2019 2237   PROTEINUR 30 (A) 10/22/2019 2237   NITRITE NEGATIVE 10/22/2019 2237   LEUKOCYTESUR NEGATIVE 10/22/2019 2237   Sepsis Labs: (procalcitonin:4,lacticidven:4) ) Recent Results (from the past 240 hour(s))  SARS CORONAVIRUS 2 (TAT 6-24 HRS) Nasopharyngeal Nasopharyngeal Swab     Status: None   Collection Time: 10/22/19  5:00 PM   Specimen:  Nasopharyngeal Swab  Result Value Ref Range Status   SARS Coronavirus 2 NEGATIVE NEGATIVE Final    Comment: (NOTE) SARS-CoV-2 target nucleic acids are NOT DETECTED. The SARS-CoV-2 RNA is generally detectable in upper and lower respiratory specimens during the acute phase of infection. Negative results do not preclude SARS-CoV-2 infection, do not rule out co-infections with other pathogens, and should not be used as the sole basis for treatment or other patient management decisions. Negative results must be combined with clinical observations, patient history, and epidemiological information. The expected result is Negative. Fact Sheet for Patients: HairSlick.no Fact Sheet for Healthcare Providers: quierodirigir.com This test is not yet approved or cleared by the [place] FDA and  has been authorized for detection and/or diagnosis of SARS-CoV-2 by FDA under an Emergency Use Authorization (EUA). This EUA will remain  in effect (meaning this test can be used) for the duration of the COVID-19 declaration under Section 56 4(b)(1) of the Act, 21 U.S.C. section 360bbb-3(b)(1), unless the authorization is terminated or revoked sooner. Performed at [facility], [address], [place], [place] [address]   MRSA PCR Screening     Status: None   Collection Time: 10/23/19 12:31 AM   Specimen: Nasal  Mucosa; Nasopharyngeal  Result Value Ref Range Status   MRSA by PCR NEGATIVE NEGATIVE Final    Comment:        The GeneXpert MRSA Assay (FDA approved for NASAL specimens only), is one component of a comprehensive MRSA colonization surveillance program. It is not intended to diagnose MRSA infection nor to guide or monitor treatment for MRSA infections. Performed at [facility], [address], [place], [place] [address]      Scheduled Meds:  apixaban  5 mg Oral BID   Chlorhexidine Gluconate Cloth  6 each Topical Daily    diltiazem  30 mg Oral Q6H   furosemide  40 mg Oral BID   ipratropium  0.5 mg Nebulization TID   levalbuterol  0.63 mg Nebulization TID   nicotine  14 mg Transdermal Daily   terbinafine   Topical BID   Continuous Infusions:  sodium chloride 10 mL/hr at 10/25/19 1800   clindamycin (CLEOCIN) IV 600 mg (10/26/19 1413)    Procedures/Studies: US Venous Img Lower Bilateral  Result Date: 10/23/2019 CLINICAL DATA:  Chronic bilateral lower extremity pain and edema, worse during the past 1-2 weeks. EXAM: BILATERAL LOWER EXTREMITY VENOUS DOPPLER ULTRASOUND TECHNIQUE: Gray-scale sonography with graded compression, as well as color Doppler and duplex ultrasound were performed to evaluate the lower extremity deep venous systems from the level of the common femoral vein and including the common femoral, femoral, profunda femoral, popliteal and calf veins including the posterior tibial, peroneal and gastrocnemius veins when visible. The superficial great saphenous vein was also interrogated. Spectral Doppler was utilized to evaluate flow at rest and with distal augmentation maneuvers in the common femoral, femoral and popliteal veins. COMPARISON:  None. FINDINGS: Examination is degraded due to patient body habitus and poor sonographic window. RIGHT LOWER EXTREMITY Common Femoral Vein: No evidence of thrombus. Normal compressibility, respiratory phasicity and response to augmentation. Saphenofemoral Junction: No evidence of thrombus. Normal compressibility and flow on color Doppler imaging. Profunda Femoral Vein: No evidence of thrombus. Normal compressibility and flow on color Doppler imaging. Femoral Vein: No evidence of thrombus. Normal compressibility, respiratory phasicity and response to augmentation. Popliteal Vein: No evidence of thrombus. Normal compressibility, respiratory phasicity and response to augmentation. Calf Veins: No evidence of thrombus. Normal compressibility and flow on color Doppler  imaging. Superficial Great Saphenous Vein: No evidence of thrombus. Normal compressibility. Venous Reflux:  None. Other Findings:  None. LEFT LOWER EXTREMITY Common Femoral Vein: No evidence of thrombus. Normal compressibility, respiratory phasicity and response to augmentation. Saphenofemoral Junction: No evidence of thrombus. Normal compressibility and flow on color Doppler imaging. Profunda Femoral Vein: No evidence of thrombus. Normal compressibility and flow on color Doppler imaging. Femoral Vein: No evidence of thrombus. Normal compressibility, respiratory phasicity and response to augmentation. Popliteal Vein: No evidence of thrombus. Normal compressibility, respiratory phasicity and response to augmentation. Calf Veins: Appear patent where imaged. Superficial Great Saphenous Vein: No evidence of thrombus. Normal compressibility. Venous Reflux:  None. Other Findings:  None. IMPRESSION: No evidence of DVT within either lower extremity. Electronically Signed   By: Simonne Come M.D.   On: 10/23/2019 13:02   Dg Chest Port 1 View  Result Date: 10/22/2019 CLINICAL DATA:  Shortness of breath EXAM: PORTABLE CHEST 1 VIEW COMPARISON:  None. FINDINGS: Lungs are clear. There is cardiomegaly with pulmonary vascularity within normal limits. No adenopathy. No bone lesions. IMPRESSION: Cardiomegaly.  No edema or consolidation. Electronically Signed   By: Bretta Bang III M.D.   On: 10/22/2019 15:52  Roxan Hockey, MD  [facility]  If 7PM-7AM, please contact night-coverage [email or web address] Password [email or web address] 10/26/2019, 7:18 PM   LOS: 4 days

## 2019-10-26 NOTE — Progress Notes (Signed)
PTs HR elevated 120-140s at rest upon assessment. MD paged and notified. Continue to monitor and await orders if appropriate.

## 2019-10-26 NOTE — Progress Notes (Signed)
Pt ambulated around unit twice on room air. While ambulating pt able to maintain saturations around 93%. No complaints of shortness of breath or palpitations. HR did increases to 130's while ambulating but able to come back down to low 100's once at rest.

## 2019-10-26 NOTE — Progress Notes (Addendum)
Progress Note  Patient Name: Adam Marquez Date of Encounter: 10/26/2019  Primary Cardiologist: Dina RichBranch, Jonathan, MD   Subjective   No chest pain and SOB is improved, pt stated that with increase of fluids his BP went down and now with decrease of fluids BP increased.     Inpatient Medications    Scheduled Meds: . apixaban  5 mg Oral BID  . Chlorhexidine Gluconate Cloth  6 each Topical Daily  . furosemide  40 mg Intravenous BID  . ipratropium  0.5 mg Nebulization TID  . levalbuterol  0.63 mg Nebulization TID  . metoprolol tartrate  50 mg Oral BID  . nicotine  14 mg Transdermal Daily  . terbinafine   Topical BID   Continuous Infusions: . sodium chloride 10 mL/hr at 10/25/19 1800  . clindamycin (CLEOCIN) IV 600 mg (10/26/19 0507)   PRN Meds: acetaminophen **OR** acetaminophen, levalbuterol, prochlorperazine   Vital Signs    Vitals:   10/26/19 0500 10/26/19 0600 10/26/19 0748 10/26/19 0750  BP: (!) 142/102 (!) 145/107    Pulse: (!) 102 97    Resp: 15 19    Temp:   98.4 F (36.9 C)   TempSrc:   Oral   SpO2: 96% 93%  94%  Weight: (!) 139.1 kg     Height:        Intake/Output Summary (Last 24 hours) at 10/26/2019 0759 Last data filed at 10/26/2019 0748 Gross per 24 hour  Intake 749.83 ml  Output 4800 ml  Net -4050.17 ml   Last 3 Weights 10/26/2019 10/25/2019 10/24/2019  Weight (lbs) 306 lb 10.6 oz 318 lb 5.5 oz 327 lb 13.2 oz  Weight (kg) 139.1 kg 144.4 kg 148.7 kg      Telemetry    A fib more rate control though up at times to 110-120 - Personally Reviewed  ECG    No new - Personally Reviewed  Physical Exam   GEN: No acute distress.   Neck: No JVD Cardiac: irreg irreg, no murmurs, rubs, or gallops.  Respiratory: rales and rhonchi to auscultation bilaterally 1/3 to 1/2 up bil. GI: Soft, nontender, non-distended  MS: improved edema still 1-2 + lower ext;, tender legs to touch, No deformity. Neuro:  Nonfocal  Psych: Normal affect   Labs     High Sensitivity Troponin:  No results for input(s): TROPONINIHS in the last 720 hours.    Chemistry Recent Labs  Lab 10/22/19 1528 10/24/19 0402 10/26/19 0416  NA 138 139 140  K 4.2 4.2 3.5  CL 97* 96* 93*  CO2 32 32 33*  GLUCOSE 101* 133* 104*  BUN 10 19 21   CREATININE 1.06 1.02 0.95  CALCIUM 9.0 9.1 9.0  PROT 6.3*  --   --   ALBUMIN 3.2*  --   --   AST 19  --   --   ALT 20  --   --   ALKPHOS 84  --   --   BILITOT 1.1  --   --   GFRNONAA >60 >60 >60  GFRAA >60 >60 >60  ANIONGAP 9 11 14      Hematology Recent Labs  Lab 10/23/19 0424 10/24/19 0402 10/26/19 0416  WBC 6.8 8.2 6.6  RBC 5.09 4.62 4.80  HGB 16.1 14.9 15.4  HCT 52.3* 47.4 48.9  MCV 102.8* 102.6* 101.9*  MCH 31.6 32.3 32.1  MCHC 30.8 31.4 31.5  RDW 15.5 15.2 15.2  PLT 171 193 202    BNP Recent Labs  Lab 10/22/19 1528  BNP 247.0*     DDimer No results for input(s): DDIMER in the last 168 hours.   Radiology    No results found.  Cardiac Studies   Echo 10/23/19 IMPRESSIONS    1. Left ventricular ejection fraction, by visual estimation, is 55 to 60%. The left ventricle has normal function. There is mildly increased left ventricular hypertrophy.  2. Left ventricular diastolic Doppler parameters are indeterminate pattern of LV diastolic filling.  3. Global right ventricle has low normal systolic function.The right ventricular size is normal. No increase in right ventricular wall thickness.  4. Left atrial size was moderately dilated.  5. Right atrial size was not well visualized.  6. The mitral valve was not well visualized. No evidence of mitral valve regurgitation. No evidence of mitral stenosis.  7. The tricuspid valve is not well visualized. Tricuspid valve regurgitation was not visualized by color flow Doppler.  8. The aortic valve has an indeterminant number of cusps Aortic valve regurgitation was not visualized by color flow Doppler. Structurally normal aortic valve, with no  evidence of sclerosis or stenosis.  9. The pulmonic valve was not well visualized. Pulmonic valve regurgitation is not visualized by color flow Doppler. 10. The inferior vena cava is dilated in size with >50% respiratory variability, suggesting right atrial pressure of 8 mmHg.  FINDINGS  Left Ventricle: Left ventricular ejection fraction, by visual estimation, is 55 to 60%. The left ventricle has normal function. There is mildly increased left ventricular hypertrophy. Spectral Doppler shows Left ventricular diastolic Doppler parameters  are indeterminate pattern of LV diastolic filling.  Right Ventricle: The right ventricular size is normal. No increase in right ventricular wall thickness. Global RV systolic function is has low normal systolic function.  Left Atrium: Left atrial size was moderately dilated.  Right Atrium: Right atrial size was not well visualized  Pericardium: There is no evidence of pericardial effusion.  Mitral Valve: The mitral valve was not well visualized. No evidence of mitral valve stenosis by observation. No evidence of mitral valve regurgitation.  Tricuspid Valve: The tricuspid valve is not well visualized. Tricuspid valve regurgitation was not visualized by color flow Doppler.  Aortic Valve: The aortic valve has an indeterminant number of cusps. Aortic valve regurgitation was not visualized by color flow Doppler. The aortic valve is structurally normal, with no evidence of sclerosis or stenosis. Aortic valve mean gradient  measures 2.8 mmHg. Aortic valve peak gradient measures 6.4 mmHg.  Pulmonic Valve: The pulmonic valve was not well visualized. Pulmonic valve regurgitation is not visualized by color flow Doppler. No evidence of pulmonic stenosis.  Aorta: The aortic root is normal in size and structure.  Pulmonary Artery: Indeterminant PASP, inadequate TR jet.    Venous: The inferior vena cava is dilated in size with greater than 50% respiratory  variability, suggesting right atrial pressure of 8 mmHg.  IAS/Shunts: No atrial level shunt detected by color flow Doppler.    Patient Profile     63 y.o. male with a hx of HTN and COPD now admitted with atrial fib and acute HF.    Assessment & Plan    1. Afib - new diagnosis for patient -  in rate controlled afib this AM with occ increase - started lopressor 50mg  bid, ? Increase though concern for COPD vs adding po dilt.  Will defer to Dr. . .  - TSH 2.139 and free T4 1.19 - plan to bring back in 3 weeks for DCCV once on  anticoagulation for that long if HR allows.   - CHADS2Vasc score is difficult to assess as he has no pcp and limited chronic diagnoses. He does have clinical HF and also history of HTN, score is at least 2 and thus anticoag is indicated - he was started on hep gtt, now on oral eliquis 5mg  bid   2. Acute heart failure- diastolic and with a fib - echo with EF 55-60%, mild LVH. indeterminate pattern of LV diastolic filling.  - likely exacerbated by afib with RVR. With HTN not on home meds risk for HTN CM - started lasix 40mg  IV bid -  neg 9748 since admit and wt down from 151.3 Kg (332.86 lbs) to 139.1 kg (306lbs)  neg 26.86 lbs - kidney function is stable with Cr 0.95.   3.  HTN BP 145/107 to 142/95 and rate controlled.  On BB  4.  Tobacco use on nicotine now  5.  COPD per IM      For questions or updates, please contact [facility] Please consult [email or web address] for contact info under        Signed, Cecilie Kicks, NP  10/26/2019, 7:59 AM    The patient was seen and examined, and I agree with the history, physical exam, assessment and plan as documented above, with modifications as noted below. I have also personally reviewed all relevant documentation, old records, labs, and both radiographic and cardiovascular studies. I have also independently interpreted old and new ECG's.  He said he feels "all right "this morning.  He had just  been up and about and his heart rate is in the 130-140 bpm range but had been lower earlier this morning at rest.  He still has bilateral leg edema.  Physical exam notable for expiratory wheezes.  He has COPD and has smoked for several years and is down to 1/2 pack/day.  He also has sleep apnea and does not use CPAP.  He just moved here from the [place], [place], area and has not found a PCP yet.  Renal function is stable.  Recommendations: Given his COPD and expiratory wheezing, I will discontinue metoprolol and switch to short acting diltiazem 30 mg every 6 hours which can be increased as needed.  He was occasionally bradycardic to 30 bpm range yesterday during late morning hours when he was on metoprolol 50 mg 3 times daily. His blood pressure has been steadily increasing as well and this will need further monitoring.  Could consider switching IV Lasix to oral 40 mg daily within the next 24 hours.  Continue apixaban 5 mg twice daily for systemic anticoagulation.  I would recommend direct-current cardioversion after 3 weeks of uninterrupted anticoagulation.   Kate Sable, MD, [facility]  10/26/2019 9:41 AM

## 2019-10-27 MED ORDER — DILTIAZEM HCL ER COATED BEADS 240 MG PO CP24
240.0000 mg | ORAL_CAPSULE | Freq: Every day | ORAL | Status: DC
  Filled 2019-10-27: qty 1

## 2019-10-27 MED ORDER — DILTIAZEM HCL 30 MG PO TABS
30.0000 mg | ORAL_TABLET | Freq: Once | ORAL | Status: AC
  Administered 2019-10-27: 08:00:00 30 mg via ORAL
  Filled 2019-10-27: qty 1

## 2019-10-27 MED ORDER — ALPRAZOLAM 0.5 MG PO TABS
0.5000 mg | ORAL_TABLET | Freq: Once | ORAL | Status: AC
  Administered 2019-10-27: 0.5 mg via ORAL
  Filled 2019-10-27: qty 1

## 2019-10-27 MED ORDER — DILTIAZEM HCL ER COATED BEADS 180 MG PO CP24
180.0000 mg | ORAL_CAPSULE | Freq: Every day | ORAL | Status: DC
  Administered 2019-10-27: 10:00:00 180 mg via ORAL
  Filled 2019-10-27: qty 1

## 2019-10-27 MED ORDER — DILTIAZEM HCL 30 MG PO TABS
30.0000 mg | ORAL_TABLET | Freq: Once | ORAL | Status: AC
  Administered 2019-10-27: 30 mg via ORAL
  Filled 2019-10-27: qty 1

## 2019-10-27 NOTE — Progress Notes (Signed)
Progress Note  Patient Name: Adam Marquez Date of Encounter: 10/27/2019  Primary Cardiologist: Carlyle Dolly, MD   Subjective   He remains tachycardic.  I switched him from metoprolol to short acting diltiazem yesterday.  Internal medicine has ordered long-acting diltiazem 180 mg daily to begin at 10 AM.  He denies chest pain.  Shortness of breath fluctuates.  Inpatient Medications    Scheduled Meds: . apixaban  5 mg Oral BID  . Chlorhexidine Gluconate Cloth  6 each Topical Daily  . diltiazem  180 mg Oral Daily  . furosemide  40 mg Oral BID  . ipratropium  0.5 mg Nebulization TID  . levalbuterol  0.63 mg Nebulization TID  . nicotine  14 mg Transdermal Daily  . terbinafine   Topical BID   Continuous Infusions: . sodium chloride Stopped (10/26/19 1413)  . clindamycin (CLEOCIN) IV Stopped (10/27/19 6195)   PRN Meds: acetaminophen **OR** acetaminophen, levalbuterol, prochlorperazine   Vital Signs    Vitals:   10/27/19 0500 10/27/19 0600 10/27/19 0700 10/27/19 0800  BP: 129/89 132/77 110/67 124/83  Pulse: (!) 53 (!) 109 (!) 109 (!) 108  Resp: 17 (!) 23 20 (!) 21  Temp:      TempSrc:      SpO2: 92% 93% 97% 91%  Weight: (!) 138.7 kg     Height:        Intake/Output Summary (Last 24 hours) at 10/27/2019 0916 Last data filed at 10/27/2019 0900 Gross per 24 hour  Intake 869.61 ml  Output 4200 ml  Net -3330.39 ml   Filed Weights   10/25/19 0500 10/26/19 0500 10/27/19 0500  Weight: (!) 144.4 kg (!) 139.1 kg (!) 138.7 kg    Telemetry    Rapid atrial fibrillation, 120-140 bpm- Personally Reviewed   Physical Exam   GEN: No acute distress.   Neck: No JVD Cardiac:  Tachycardic, irregular, no murmurs, rubs, or gallops.  Respiratory: Clear to auscultation bilaterally. GI: Soft, nontender, non-distended  MS:  1+ bilateral LE edema; No deformity. Neuro:  Nonfocal  Psych: Normal affect   Labs    Chemistry Recent Labs  Lab 10/22/19 1528 10/24/19  0402 10/26/19 0416  NA 138 139 140  K 4.2 4.2 3.5  CL 97* 96* 93*  CO2 32 32 33*  GLUCOSE 101* 133* 104*  BUN 10 19 21   CREATININE 1.06 1.02 0.95  CALCIUM 9.0 9.1 9.0  PROT 6.3*  --   --   ALBUMIN 3.2*  --   --   AST 19  --   --   ALT 20  --   --   ALKPHOS 84  --   --   BILITOT 1.1  --   --   GFRNONAA >60 >60 >60  GFRAA >60 >60 >60  ANIONGAP 9 11 14      Hematology Recent Labs  Lab 10/23/19 0424 10/24/19 0402 10/26/19 0416  WBC 6.8 8.2 6.6  RBC 5.09 4.62 4.80  HGB 16.1 14.9 15.4  HCT 52.3* 47.4 48.9  MCV 102.8* 102.6* 101.9*  MCH 31.6 32.3 32.1  MCHC 30.8 31.4 31.5  RDW 15.5 15.2 15.2  PLT 171 193 202    Cardiac EnzymesNo results for input(s): TROPONINI in the last 168 hours. No results for input(s): TROPIPOC in the last 168 hours.   BNP Recent Labs  Lab 10/22/19 1528  BNP 247.0*     DDimer No results for input(s): DDIMER in the last 168 hours.   Radiology  No results found.  Cardiac Studies   Echo 10/23/19 IMPRESSIONS   1. Left ventricular ejection fraction, by visual estimation, is 55 to 60%. The left ventricle has normal function. There is mildly increased left ventricular hypertrophy. 2. Left ventricular diastolic Doppler parameters are indeterminate pattern of LV diastolic filling. 3. Global right ventricle has low normal systolic function.The right ventricular size is normal. No increase in right ventricular wall thickness. 4. Left atrial size was moderately dilated. 5. Right atrial size was not well visualized. 6. The mitral valve was not well visualized. No evidence of mitral valve regurgitation. No evidence of mitral stenosis. 7. The tricuspid valve is not well visualized. Tricuspid valve regurgitation was not visualized by color flow Doppler. 8. The aortic valve has an indeterminant number of cusps Aortic valve regurgitation was not visualized by color flow Doppler. Structurally normal aortic valve, with no evidence of sclerosis  or stenosis. 9. The pulmonic valve was not well visualized. Pulmonic valve regurgitation is not visualized by color flow Doppler. 10. The inferior vena cava is dilated in size with >50% respiratory variability, suggesting right atrial pressure of 8 mmHg.  FINDINGS Left Ventricle: Left ventricular ejection fraction, by visual estimation, is 55 to 60%. The left ventricle has normal function. There is mildly increased left ventricular hypertrophy. Spectral Doppler shows Left ventricular diastolic Doppler parameters  are indeterminate pattern of LV diastolic filling.  Right Ventricle: The right ventricular size is normal. No increase in right ventricular wall thickness. Global RV systolic function is has low normal systolic function.  Left Atrium: Left atrial size was moderately dilated.  Right Atrium: Right atrial size was not well visualized  Pericardium: There is no evidence of pericardial effusion.  Mitral Valve: The mitral valve was not well visualized. No evidence of mitral valve stenosis by observation. No evidence of mitral valve regurgitation.  Tricuspid Valve: The tricuspid valve is not well visualized. Tricuspid valve regurgitation was not visualized by color flow Doppler.  Aortic Valve: The aortic valve has an indeterminant number of cusps. Aortic valve regurgitation was not visualized by color flow Doppler. The aortic valve is structurally normal, with no evidence of sclerosis or stenosis. Aortic valve mean gradient  measures 2.8 mmHg. Aortic valve peak gradient measures 6.4 mmHg.  Pulmonic Valve: The pulmonic valve was not well visualized. Pulmonic valve regurgitation is not visualized by color flow Doppler. No evidence of pulmonic stenosis.  Aorta: The aortic root is normal in size and structure.  Pulmonary Artery: Indeterminant PASP, inadequate TR jet.   Venous: The inferior vena cava is dilated in size with greater than 50% respiratory variability, suggesting  right atrial pressure of 8 mmHg.  IAS/Shunts: No atrial level shunt detected by color flow Doppler.    Patient Profile     63 y.o. male with history of hypertension and COPD admitted with atrial fibrillation and acute diastolic heart failure.  Assessment & Plan    1.  New onset atrial fibrillation: Heart rate remains elevated.  I switched him for metoprolol to short acting diltiazem yesterday given his COPD and expiratory wheezing.  He previously had issues with bradycardia on metoprolol 50 mg 3 times daily.  Internal medicine has ordered long-acting diltiazem 180 mg daily to be given at 10 AM.  I think this is reasonable.  Continue apixaban 5 mg twice daily for systemic anticoagulation.  I would recommend direct-current cardioversion after 3 weeks of uninterrupted anticoagulation.  2.  Hypertension: Blood pressure is better controlled on diltiazem.  3.  COPD:  He needs smoking cessation.  He smokes about 1/2 pack of cigarettes daily.  Continue bronchodilators.  4.  Tobacco abuse: He is smoking cessation.  He smokes about 1/2 pack of cigarettes daily.  5. Acute diastolic heart failure: Likely secondary to rapid atrial fibrillation.  He is now on oral Lasix 40 mg twice daily.  He has put out over 3.5 L in the last 24+ hours.  Renal function is normal.  BUN is steadily climbing.    For questions or updates, please contact [facility] Please consult [email or web address] for contact info under Cardiology/STEMI.      Signed, Prentice DockerSuresh Koneswaran, MD  10/27/2019, 9:16 AM

## 2019-10-27 NOTE — TOC Progression Note (Signed)
Transition of Care [facility]) - Progression Note    Patient Details  Name: Adam Marquez MRN: [id number] Date of Birth: 08-Jan-1956  [facility] of [facility]) CM/SW Contact  Ihor Gully, LCSW Phone Number: 10/27/2019, 11:33 AM  Clinical Narrative:    Patient will need Eliquis at discharge. Spoke with [place], at [place]. Advised that patient was unemployed and uninsured. [facility] will see patient once he is discharges. They will also do a prescription assistance program application for patient. Patient will qualify due to being unemployed and uninsured. [facility] will also provide patient with two week supply while prescription assistance application is pending. Patient has been provided an Eliquis coupon for the first 30 days free medication.   Expected Discharge Plan: Home/Self Care Barriers to Discharge: Continued Medical Work up  Expected Discharge Plan and Services Expected Discharge Plan: Home/Self Care In-house Referral: Clinical Social Work     Living arrangements for the past 2 months: Single Family Home                                       Social Determinants of Health (SDOH) Interventions    Readmission Risk Interventions Readmission Risk Prevention Plan 10/23/2019  Post Dischage Appt Complete  Medication Screening Complete  Transportation Screening Complete

## 2019-10-27 NOTE — Progress Notes (Signed)
PROGRESS NOTE  Adam Marquez VWU:[id number] DOB: 1956/08/01 DOA: 10/22/2019 PCP: Patient, No Pcp Per  Brief History: 63 year old male with a history of hypertension, morbid obesity, tobacco abuse, and suspected COPD presenting with progressive shortness of breath that was significantly worsened over the past 2 days. In addition, he is concerned about increasing lower extremity edema and erythema of his bilateral lower extremities for the past 2 days. The patient has not been seen a physician in approximately 1 year since he moved to [place] from [place]. He states that he has not been on any medications but previously was on antihypertensive medications.He continues to smoke 1/2 pack/day but previously smoked 1-1/2 packs x 30 years. The patient also describes orthopnea type symptoms requiring him to sleep sitting upright. He denies any fevers, chills, chest pain, coughing, hemoptysis, nausea, vomiting, direct abdominal pain. In the emergency department, the patient was afebrile and hemodynamically stable with oxygen saturations of 93% on 2 L. BMP and CBC were essentially unremarkable. BNP was 247. Chest x-ray showed cardiomegaly with mild increased interstitial markings. EKG showed atrial fibrillation with nonspecific T wave changes.  Assessment/Plan:  1)Acute diastolic CHF -Echocardiogram--EF 55-60%, dilated IVC -Treated with IV Lasix, transition to oral Lasix -Fluid balance negative -Improved overall, - dyspnea on exertion-improved   hypoxia has -resolved  2)New onset atrial fibrillation with RVR, type unspecified -New diagnosis -Off Cardizem drip, cardiologist discontinued metoprolol due to bradycardia and bronchospasms concerns -Continue apixaban -Discontinued IV heparin -TSH--2.139 -CHADSVASc at least 2 -Significant tachycardia with new activity with heart rate in the 130s to 150s persist, -Difficulties with rate control due to bradycardia and  bronchospasm--hence cardiology discontinued metoprolol, -Continue to titrate Cardizem for rate control -Unable to discharge due to symptomatic tachycardia with minimal activity and further need for rate control/titration -Give Cardizem 30 mg p.o. x1 this a.m., follow with Cardizem CD 180 mg daily -Plan will be for cardioversion after at least 3 weeks of uninterrupted anticoagulation  3)Tobacco abuse/COPD -Smoking cessation advised, continue bronchodilators  4)Morbid obesity -BMI 45.24--this complicates overall care  5) social/ethics----patient full code, post cardioversion patient will need to be on anticoagulation for at least 4 weeks -Discussed with pharmacist and with social worker, patient can get Eliquis for free for the first 1 month when he leaves the hospital, however post cardioversion he will need anticoagulation for at least another month--- -patient has no prescription/insurance coverage at this time  6)Essential hypertension---BP improved with Cardizem  7)Cellulitis lower extremity---improved, -ok to  Complete empiricclindamycin -Lower extremity venous Dopplers negative for DVT  8)Tinea cruris -c/n  terbinafine cream    Disposition Plan: Home when rate control improved --Unable to discharge due to symptomatic tachycardia with minimal activity and further need for rate control/titration  Family Communication:NoFamily at bedside  Consultants:cardiology  Code Status: FULL   DVT Prophylaxis:apixaban   Procedures: As Listed in Progress Note Above  Antibiotics: clinda IV 10/23>>>10/27/2019    Subjective: -Tachycardia at rest and with minimal activity persist heart rate up to the 140s -Occasional dizziness -Shortness of breath is improved  No Nausea, Vomiting or Diarrhea    Objective: Vitals:   10/27/19 0800 10/27/19 0900 10/27/19 1000 10/27/19 1027  BP: 124/83 117/64 112/76   Pulse: (!) 108 (!) 144 (!) 110 81  Resp: (!)  21 (!) 28 (!) 27 (!) 24  Temp:      TempSrc:      SpO2: 91% 95% 93% 96%  Weight:  Height:        Intake/Output Summary (Last 24 hours) at 10/27/2019 1102 Last data filed at 10/27/2019 0900 Gross per 24 hour  Intake 869.61 ml  Output 4200 ml  Net -3330.39 ml   Weight change: -0.4 kg Exam:   General:-Able to speak in sentences   HEENT: No icterus, No thrush, No neck mass, Effie/AT  Cardiovascular: IRRR, S1/S2, tachycardic  Respiratory: Improving air movement, no wheezing rales, no wheezing   abdomen: Soft/+BS, non tender, non distended, no guarding Extremities: 1+ LE edema, pedal pulses present  Data Reviewed: Basic Metabolic Panel: Recent Labs  Lab 10/22/19 1524 10/22/19 1528 10/24/19 0402 10/26/19 0416  NA  --  138 139 140  K  --  4.2 4.2 3.5  CL  --  97* 96* 93*  CO2  --  32 32 33*  GLUCOSE  --  101* 133* 104*  BUN  --  10 19 21   CREATININE  --  1.06 1.02 0.95  CALCIUM  --  9.0 9.1 9.0  MG 2.1  --  2.1 2.0  PHOS 3.2  --   --   --    Liver Function Tests: Recent Labs  Lab 10/22/19 1528  AST 19  ALT 20  ALKPHOS 84  BILITOT 1.1  PROT 6.3*  ALBUMIN 3.2*   No results for input(s): LIPASE, AMYLASE in the last 168 hours. No results for input(s): AMMONIA in the last 168 hours. Coagulation Profile: Recent Labs  Lab 10/22/19 1524  INR 1.2   CBC: Recent Labs  Lab 10/22/19 1528 10/23/19 0424 10/24/19 0402 10/26/19 0416  WBC 6.7 6.8 8.2 6.6  NEUTROABS 4.4  --   --   --   HGB 15.8 16.1 14.9 15.4  HCT 51.1 52.3* 47.4 48.9  MCV 103.0* 102.8* 102.6* 101.9*  PLT 191 171 193 202   Cardiac Enzymes: No results for input(s): CKTOTAL, CKMB, CKMBINDEX, TROPONINI in the last 168 hours. BNP: Invalid input(s): POCBNP CBG: No results for input(s): GLUCAP in the last 168 hours. HbA1C: No results for input(s): HGBA1C in the last 72 hours. Urine analysis:    Component Value Date/Time   COLORURINE YELLOW 10/22/2019 2237   APPEARANCEUR CLEAR 10/22/2019  2237   LABSPEC 1.017 10/22/2019 2237   PHURINE 6.0 10/22/2019 2237   GLUCOSEU NEGATIVE 10/22/2019 2237   HGBUR NEGATIVE 10/22/2019 2237   BILIRUBINUR NEGATIVE 10/22/2019 2237   KETONESUR 5 (A) 10/22/2019 2237   PROTEINUR 30 (A) 10/22/2019 2237   NITRITE NEGATIVE 10/22/2019 2237   LEUKOCYTESUR NEGATIVE 10/22/2019 2237   Sepsis Labs: @LABRCNTIP (procalcitonin:4,lacticidven:4) ) Recent Results (from the past 240 hour(s))  SARS CORONAVIRUS 2 (TAT 6-24 HRS) Nasopharyngeal Nasopharyngeal Swab     Status: None   Collection Time: 10/22/19  5:00 PM   Specimen: Nasopharyngeal Swab  Result Value Ref Range Status   SARS Coronavirus 2 NEGATIVE NEGATIVE Final    Comment: (NOTE) SARS-CoV-2 target nucleic acids are NOT DETECTED. The SARS-CoV-2 RNA is generally detectable in upper and lower respiratory specimens during the acute phase of infection. Negative results do not preclude SARS-CoV-2 infection, do not rule out co-infections with other pathogens, and should not be used as the sole basis for treatment or other patient management decisions. Negative results must be combined with clinical observations, patient history, and epidemiological information. The expected result is Negative. Fact Sheet for Patients: SugarRoll.be Fact Sheet for Healthcare Providers: https://www.woods-mathews.com/ This test is not yet approved or cleared by the [place] FDA and  has been  authorized for detection and/or diagnosis of SARS-CoV-2 by FDA under an Emergency Use Authorization (EUA). This EUA will remain  in effect (meaning this test can be used) for the duration of the COVID-19 declaration under Section 56 4(b)(1) of the Act, 21 U.S.C. section 360bbb-3(b)(1), unless the authorization is terminated or revoked sooner. Performed at [facility], [address] [place], [place] [address]   MRSA PCR Screening     Status: None   Collection Time: 10/23/19  12:31 AM   Specimen: Nasal Mucosa; Nasopharyngeal  Result Value Ref Range Status   MRSA by PCR NEGATIVE NEGATIVE Final    Comment:        The GeneXpert MRSA Assay (FDA approved for NASAL specimens only), is one component of a comprehensive MRSA colonization surveillance program. It is not intended to diagnose MRSA infection nor to guide or monitor treatment for MRSA infections. Performed at [facility], [address] [place], [place] [address]      Scheduled Meds: . apixaban  5 mg Oral BID  . Chlorhexidine Gluconate Cloth  6 each Topical Daily  . diltiazem  180 mg Oral Daily  . furosemide  40 mg Oral BID  . ipratropium  0.5 mg Nebulization TID  . levalbuterol  0.63 mg Nebulization TID  . nicotine  14 mg Transdermal Daily  . terbinafine   Topical BID   Continuous Infusions: . sodium chloride Stopped (10/26/19 1413)  . clindamycin (CLEOCIN) IV Stopped (10/27/19 [id number])    Procedures/Studies: [place] Venous Img Lower Bilateral  Result Date: 10/23/2019 CLINICAL DATA:  Chronic bilateral lower extremity pain and edema, worse during the past 1-2 weeks. EXAM: BILATERAL LOWER EXTREMITY VENOUS DOPPLER ULTRASOUND TECHNIQUE: Gray-scale sonography with graded compression, as well as color Doppler and duplex ultrasound were performed to evaluate the lower extremity deep venous systems from the level of the common femoral vein and including the common femoral, femoral, profunda femoral, popliteal and calf veins including the posterior tibial, peroneal and gastrocnemius veins when visible. The superficial great saphenous vein was also interrogated. Spectral Doppler was utilized to evaluate flow at rest and with distal augmentation maneuvers in the common femoral, femoral and popliteal veins. COMPARISON:  None. FINDINGS: Examination is degraded due to patient body habitus and poor sonographic window. RIGHT LOWER EXTREMITY Common Femoral Vein: No evidence of thrombus. Normal compressibility,  respiratory phasicity and response to augmentation. Saphenofemoral Junction: No evidence of thrombus. Normal compressibility and flow on color Doppler imaging. Profunda Femoral Vein: No evidence of thrombus. Normal compressibility and flow on color Doppler imaging. Femoral Vein: No evidence of thrombus. Normal compressibility, respiratory phasicity and response to augmentation. Popliteal Vein: No evidence of thrombus. Normal compressibility, respiratory phasicity and response to augmentation. Calf Veins: No evidence of thrombus. Normal compressibility and flow on color Doppler imaging. Superficial Great Saphenous Vein: No evidence of thrombus. Normal compressibility. Venous Reflux:  None. Other Findings:  None. LEFT LOWER EXTREMITY Common Femoral Vein: No evidence of thrombus. Normal compressibility, respiratory phasicity and response to augmentation. Saphenofemoral Junction: No evidence of thrombus. Normal compressibility and flow on color Doppler imaging. Profunda Femoral Vein: No evidence of thrombus. Normal compressibility and flow on color Doppler imaging. Femoral Vein: No evidence of thrombus. Normal compressibility, respiratory phasicity and response to augmentation. Popliteal Vein: No evidence of thrombus. Normal compressibility, respiratory phasicity and response to augmentation. Calf Veins: Appear patent where imaged. Superficial Great Saphenous Vein: No evidence of thrombus. Normal compressibility. Venous Reflux:  None. Other Findings:  None. IMPRESSION: No evidence  of DVT within either lower extremity. Electronically Signed   By: Simonne Come M.D.   On: 10/23/2019 13:02   Dg Chest Port 1 View  Result Date: 10/22/2019 CLINICAL DATA:  Shortness of breath EXAM: PORTABLE CHEST 1 VIEW COMPARISON:  None. FINDINGS: Lungs are clear. There is cardiomegaly with pulmonary vascularity within normal limits. No adenopathy. No bone lesions. IMPRESSION: Cardiomegaly.  No edema or consolidation. Electronically Signed    By: Bretta Bang III M.D.   On: 10/22/2019 15:52    Joylynn Defrancesco Mariea Clonts, MD  [facility]  If 7PM-7AM, please contact night-coverage [email or web address] Password [email or web address] 10/27/2019, 11:02 AM   LOS: 5 days

## 2019-10-28 LAB — BASIC METABOLIC PANEL
Anion gap: 10 (ref 5–15)
BUN: 18 mg/dL (ref 8–23)
CO2: 32 mmol/L (ref 22–32)
Calcium: 9.1 mg/dL (ref 8.9–10.3)
Chloride: 96 mmol/L — ABNORMAL LOW (ref 98–111)
Creatinine, Ser: 1.02 mg/dL (ref 0.61–1.24)
GFR calc Af Amer: >60 mL/min (ref >60)
GFR calc non Af Amer: >60 mL/min (ref >60)
Glucose, Bld: 136 mg/dL — ABNORMAL HIGH (ref 70–99)
Potassium: 3.7 mmol/L (ref 3.5–5.1)
Sodium: 138 mmol/L (ref 135–145)

## 2019-10-28 MED ORDER — SODIUM CHLORIDE 0.9 % IV SOLN: 250.0000 mL | INTRAVENOUS | Status: DC

## 2019-10-28 MED ORDER — DILTIAZEM HCL ER COATED BEADS 120 MG PO CP24
120.0000 mg | ORAL_CAPSULE | Freq: Once | ORAL | Status: AC
  Administered 2019-10-28: 21:00:00 120 mg via ORAL
  Filled 2019-10-28: qty 1

## 2019-10-28 MED ORDER — DILTIAZEM HCL ER COATED BEADS 120 MG PO CP24: 120.0000 mg | ORAL_CAPSULE | Freq: Once | ORAL | Status: DC

## 2019-10-28 MED ORDER — DILTIAZEM HCL ER COATED BEADS 180 MG PO CP24
180.0000 mg | ORAL_CAPSULE | Freq: Once | ORAL | Status: AC
  Administered 2019-10-28: 180 mg via ORAL
  Filled 2019-10-28: qty 1

## 2019-10-28 MED ORDER — SODIUM CHLORIDE 0.9% FLUSH: 3.0000 mL | INTRAVENOUS | Status: DC | PRN

## 2019-10-28 MED ORDER — SODIUM CHLORIDE 0.9% FLUSH
3.0000 mL | Freq: Two times a day (BID) | INTRAVENOUS | Status: DC
  Administered 2019-10-28 – 2019-10-30 (×5): 3 mL via INTRAVENOUS

## 2019-10-28 NOTE — Progress Notes (Signed)
Progress Note  Patient Name: Adam Marquez Gary Grace Date of Encounter: 10/28/2019  Primary Cardiologist: Dina RichBranch, Jonathan, MD   Subjective   Heart rates fluctuate and become tachycardic with any significant activity but is also been bradycardic at times with heart rates in the 40s this morning.  He is feeling much better with respect to shortness of breath and leg swelling.  He is not keen on undergoing procedures.  Inpatient Medications    Scheduled Meds: . apixaban  5 mg Oral BID  . Chlorhexidine Gluconate Cloth  6 each Topical Daily  . diltiazem  120 mg Oral ONCE-1800  . diltiazem  180 mg Oral Once  . furosemide  40 mg Oral BID  . ipratropium  0.5 mg Nebulization TID  . levalbuterol  0.63 mg Nebulization TID  . nicotine  14 mg Transdermal Daily  . terbinafine   Topical BID   Continuous Infusions: . sodium chloride Stopped (10/26/19 1413)   PRN Meds: acetaminophen **OR** acetaminophen, levalbuterol, prochlorperazine   Vital Signs    Vitals:   10/28/19 0708 10/28/19 0728 10/28/19 0800 10/28/19 0840  BP:   120/83   Pulse: 83 92 (!) 106   Resp:      Temp:  98.4 F (36.9 C)    TempSrc:  Oral    SpO2: 97% 92% (!) 89% 94%  Weight:      Height:        Intake/Output Summary (Last 24 hours) at 10/28/2019 0856 Last data filed at 10/28/2019 0500 Gross per 24 hour  Intake 50 ml  Output 1725 ml  Net -1675 ml   Filed Weights   10/26/19 0500 10/27/19 0500 10/28/19 0500  Weight: (!) 139.1 kg (!) 138.7 kg (!) 138.1 kg    Telemetry    Mostly rapid atrial fibrillation with heart rates occasion the 40 bpm range earlier this morning- Personally Reviewed  Physical Exam   GEN: No acute distress.   Neck: No JVD Cardiac:  Tachycardic, irregular, no murmurs, rubs, or gallops.  Respiratory: Clear to auscultation bilaterally. GI: Soft, nontender, non-distended  MS: No edema; No deformity. Neuro:  Nonfocal  Psych: Normal affect   Labs    Chemistry Recent Labs  Lab  10/22/19 1528 10/24/19 0402 10/26/19 0416  NA 138 139 140  K 4.2 4.2 3.5  CL 97* 96* 93*  CO2 32 32 33*  GLUCOSE 101* 133* 104*  BUN 10 19 21   CREATININE 1.06 1.02 0.95  CALCIUM 9.0 9.1 9.0  PROT 6.3*  --   --   ALBUMIN 3.2*  --   --   AST 19  --   --   ALT 20  --   --   ALKPHOS 84  --   --   BILITOT 1.1  --   --   GFRNONAA >60 >60 >60  GFRAA >60 >60 >60  ANIONGAP 9 11 14      Hematology Recent Labs  Lab 10/23/19 0424 10/24/19 0402 10/26/19 0416  WBC 6.8 8.2 6.6  RBC 5.09 4.62 4.80  HGB 16.1 14.9 15.4  HCT 52.3* 47.4 48.9  MCV 102.8* 102.6* 101.9*  MCH 31.6 32.3 32.1  MCHC 30.8 31.4 31.5  RDW 15.5 15.2 15.2  PLT 171 193 202    Cardiac EnzymesNo results for input(s): TROPONINI in the last 168 hours. No results for input(s): TROPIPOC in the last 168 hours.   BNP Recent Labs  Lab 10/22/19 1528  BNP 247.0*     DDimer No results for input(s):  DDIMER in the last 168 hours.   Radiology    No results found.  Cardiac Studies   Echo 10/23/19 IMPRESSIONS   1. Left ventricular ejection fraction, by visual estimation, is 55 to 60%. The left ventricle has normal function. There is mildly increased left ventricular hypertrophy. 2. Left ventricular diastolic Doppler parameters are indeterminate pattern of LV diastolic filling. 3. Global right ventricle has low normal systolic function.The right ventricular size is normal. No increase in right ventricular wall thickness. 4. Left atrial size was moderately dilated. 5. Right atrial size was not well visualized. 6. The mitral valve was not well visualized. No evidence of mitral valve regurgitation. No evidence of mitral stenosis. 7. The tricuspid valve is not well visualized. Tricuspid valve regurgitation was not visualized by color flow Doppler. 8. The aortic valve has an indeterminant number of cusps Aortic valve regurgitation was not visualized by color flow Doppler. Structurally normal aortic valve, with  no evidence of sclerosis or stenosis. 9. The pulmonic valve was not well visualized. Pulmonic valve regurgitation is not visualized by color flow Doppler. 10. The inferior vena cava is dilated in size with >50% respiratory variability, suggesting right atrial pressure of 8 mmHg.  FINDINGS Left Ventricle: Left ventricular ejection fraction, by visual estimation, is 55 to 60%. The left ventricle has normal function. There is mildly increased left ventricular hypertrophy. Spectral Doppler shows Left ventricular diastolic Doppler parameters  are indeterminate pattern of LV diastolic filling.  Right Ventricle: The right ventricular size is normal. No increase in right ventricular wall thickness. Global RV systolic function is has low normal systolic function.  Left Atrium: Left atrial size was moderately dilated.  Right Atrium: Right atrial size was not well visualized  Pericardium: There is no evidence of pericardial effusion.  Mitral Valve: The mitral valve was not well visualized. No evidence of mitral valve stenosis by observation. No evidence of mitral valve regurgitation.  Tricuspid Valve: The tricuspid valve is not well visualized. Tricuspid valve regurgitation was not visualized by color flow Doppler.  Aortic Valve: The aortic valve has an indeterminant number of cusps. Aortic valve regurgitation was not visualized by color flow Doppler. The aortic valve is structurally normal, with no evidence of sclerosis or stenosis. Aortic valve mean gradient  measures 2.8 mmHg. Aortic valve peak gradient measures 6.4 mmHg.  Pulmonic Valve: The pulmonic valve was not well visualized. Pulmonic valve regurgitation is not visualized by color flow Doppler. No evidence of pulmonic stenosis.  Aorta: The aortic root is normal in size and structure.  Pulmonary Artery: Indeterminant PASP, inadequate TR jet.   Venous: The inferior vena cava is dilated in size with greater than 50%  respiratory variability, suggesting right atrial pressure of 8 mmHg.  IAS/Shunts: No atrial level shunt detected by color flow Doppler.    Patient Profile     63 y.o. male with history of hypertension and COPD admitted with atrial fibrillation and acute diastolic heart failure.  Assessment & Plan    1.  New onset atrial fibrillation: Heart rates fluctuate with tachycardia with any significant activity but at times becoming bradycardic with heart rates in the 40 this morning.  Heart rates have gone down to the 30 bpm range when he was on Lopressor 50 mg 3 times daily.   I switched him for metoprolol to short acting diltiazem on 10/26 given his COPD and expiratory wheezing.    Internal medicine began long-acting diltiazem yesterday, 180 mg daily.   I spoke to him about the  possibility of TEE/DCCV.  He is not enthusiastic.  I spoke with internal medicine.  We will give 180 mg of long-acting diltiazem this morning and 120 mg this evening.  We will need to monitor for bradycardia.  He has moderate left atrial dilatation.  Given his several comorbidities including COPD, sleep apnea, and obesity, I am uncertain as to how long he would maintain sinus rhythm regardless. Continue apixaban 5 mg twice daily for systemic anticoagulation.  He will continue to think about the procedure.  We will tentatively plan for TEE/DCCV on 10/29/2019.  2.  Hypertension: Blood pressure is better controlled on diltiazem.  Monitor for hypotension given increase in dose today.  3.  COPD: He needs smoking cessation.  He smokes about 1/2 pack of cigarettes daily.  Continue bronchodilators.  4.  Tobacco abuse: He is smoking cessation.  He smokes about 1/2 pack of cigarettes daily.  5. Acute diastolic heart failure: Likely secondary to rapid atrial fibrillation.  He is now on oral Lasix 40 mg twice daily.  He has put out over 1.6 L in the last 24 hours.  Renal function has not been checked since 10/26/2019 so I will order  basic metabolic panel today.     For questions or updates, please contact [place] Please consult [email or web address] for contact info under Cardiology/STEMI.      Signed, Kate Sable, MD  10/28/2019, 8:56 AM

## 2019-10-28 NOTE — Progress Notes (Signed)
PROGRESS NOTE  Adam OreJoseph Gary Marquez ZOX:[id number] DOB: 02/20/1956 DOA: 10/22/2019 PCP: Patient, No Pcp Per  Brief History: 63 year old male with a history of hypertension, morbid obesity, tobacco abuse, and suspected COPD presenting with progressive shortness of breath that was significantly worsened over the past 2 days. In addition, he is concerned about increasing lower extremity edema and erythema of his bilateral lower extremities for the past 2 days. The patient has not been seen a physician in approximately 1 year since [place]. He states that he has not been on any medications but previously was on antihypertensive medications.He continues to smoke 1/2 pack/day but previously smoked 1-1/2 packs x 30 years. The patient also describes orthopnea type symptoms requiring him to sleep sitting upright. He denies any fevers, chills, chest pain, coughing, hemoptysis, nausea, vomiting, direct abdominal pain. In the emergency department, the patient was afebrile and hemodynamically stable with oxygen saturations of 93% on 2 L. BMP and CBC were essentially unremarkable. BNP was 247. Chest x-ray showed cardiomegaly with mild increased interstitial markings. EKG showed atrial fibrillation with nonspecific T wave changes.  Assessment/Plan:  1)Acute diastolic CHF -Echocardiogram--EF 55-60%, dilated IVC -Treated with IV Lasix, transition to oral Lasix -Fluid balance negative -Improved overall, - dyspnea on exertion-improved   hypoxia has -resolved  2)New onset atrial fibrillation with RVR, type unspecified -New diagnosis -Off Cardizem drip, cardiologist discontinued metoprolol due to bradycardia and bronchospasms concerns -Continue apixaban -Discontinued IV heparin -TSH--2.139 -CHADSVASc at least 2 -Significant tachycardia with new activity with heart rate in the 130s to 150s persist, -Difficulties with rate control due to bradycardia and  bronchospasm--hence cardiology discontinued metoprolol, -Continue to titrate Cardizem for rate control -Unable to discharge due to symptomatic tachycardia with minimal activity and further need for rate control/titration -Rate control remains challenging, Cardizem adjusted further -possible TEE with cardioversion on 10/29/2019 -Occasional episodes of bradycardia, query if patient has tachybradycardia syndrome and is pacemaker -defer to cardiology service   3)Tobacco abuse/COPD -Smoking cessation advised, continue bronchodilators  4)Morbid obesity -BMI 45.24--this complicates overall care  5)Social/ethics----patient full code, post cardioversion patient will need to be on anticoagulation for at least 4 weeks -Discussed with pharmacist and with social worker, patient can get Eliquis for free for the first 1 month when he leaves the hospital, however post cardioversion he will need anticoagulation for at least another month--- -patient has no prescription/insurance coverage at this time -May be able to get on prescription assistance program for Eliquis  6)Essential hypertension---BP improved with Cardizem  7)Cellulitis lower extremity---improved, -Overall much improved, completed clindamycin -Lower extremity venous Dopplers negative for DVT  8)Tinea cruris -c/n  terbinafine cream    Disposition Plan: Home when rate control improved --Unable to discharge due to symptomatic tachycardia with minimal activity and further need for rate control/titration--- possible TEE with cardioversion on 10/29/2019  Family Communication:NoFamily at bedside  Consultants:cardiology  Code Status: FULL   DVT Prophylaxis:apixaban   Procedures: As Listed in Progress Note Above  Antibiotics: clinda IV 10/23>>>10/27/2019    Subjective:  -Frustrated with persistent tachycardia  -Heart rate up to the 140s when he gets out of bed -Did have episodes of bradycardia  overnight as well -No chest pains but occasional dizziness, -Complains of fatigue  Dyspnea is improved    Objective: Vitals:   10/28/19 0802 10/28/19 0803 10/28/19 0809 10/28/19 0840  BP:      Pulse:      Resp:  Temp:      TempSrc:      SpO2: 94% 98% 93% 94%  Weight:      Height:        Intake/Output Summary (Last 24 hours) at 10/28/2019 0931 Last data filed at 10/28/2019 0500 Gross per 24 hour  Intake 50 ml  Output 925 ml  Net -875 ml   Weight change: -0.6 kg Exam:   General:-Able to speak in sentences   HEENT: No icterus, No thrush, No neck mass, [place]/AT  Cardiovascular: IRRR, S1/S2, tachycardic  Respiratory: Improving air movement, no wheezing rales, no wheezing   abdomen: Soft/+BS, non tender, non distended, no guarding Extremities: Trace LE edema, pedal pulses present  Data Reviewed: Basic Metabolic Panel: Recent Labs  Lab 10/22/19 1524 10/22/19 1528 10/24/19 0402 10/26/19 0416  NA  --  138 139 140  K  --  4.2 4.2 3.5  CL  --  97* 96* 93*  CO2  --  32 32 33*  GLUCOSE  --  101* 133* 104*  BUN  --  10 19 21   CREATININE  --  1.06 1.02 0.95  CALCIUM  --  9.0 9.1 9.0  MG 2.1  --  2.1 2.0  PHOS 3.2  --   --   --    Liver Function Tests: Recent Labs  Lab 10/22/19 1528  AST 19  ALT 20  ALKPHOS 84  BILITOT 1.1  PROT 6.3*  ALBUMIN 3.2*   No results for input(s): LIPASE, AMYLASE in the last 168 hours. No results for input(s): AMMONIA in the last 168 hours. Coagulation Profile: Recent Labs  Lab 10/22/19 1524  INR 1.2   CBC: Recent Labs  Lab 10/22/19 1528 10/23/19 0424 10/24/19 0402 10/26/19 0416  WBC 6.7 6.8 8.2 6.6  NEUTROABS 4.4  --   --   --   HGB 15.8 16.1 14.9 15.4  HCT 51.1 52.3* 47.4 48.9  MCV 103.0* 102.8* 102.6* 101.9*  PLT 191 171 193 202   Cardiac Enzymes: No results for input(s): CKTOTAL, CKMB, CKMBINDEX, TROPONINI in the last 168 hours. BNP: Invalid input(s): POCBNP CBG: No results for input(s): GLUCAP in the  last 168 hours. HbA1C: No results for input(s): HGBA1C in the last 72 hours. Urine analysis:    Component Value Date/Time   COLORURINE YELLOW 10/22/2019 2237   APPEARANCEUR CLEAR 10/22/2019 2237   LABSPEC 1.017 10/22/2019 2237   PHURINE 6.0 10/22/2019 2237   GLUCOSEU NEGATIVE 10/22/2019 2237   HGBUR NEGATIVE 10/22/2019 2237   BILIRUBINUR NEGATIVE 10/22/2019 2237   KETONESUR 5 (A) 10/22/2019 2237   PROTEINUR 30 (A) 10/22/2019 2237   NITRITE NEGATIVE 10/22/2019 2237   LEUKOCYTESUR NEGATIVE 10/22/2019 2237   Sepsis Labs: @LABRCNTIP (procalcitonin:4,lacticidven:4) ) Recent Results (from the past 240 hour(s))  SARS CORONAVIRUS 2 (TAT 6-24 HRS) Nasopharyngeal Nasopharyngeal Swab     Status: None   Collection Time: 10/22/19  5:00 PM   Specimen: Nasopharyngeal Swab  Result Value Ref Range Status   SARS Coronavirus 2 NEGATIVE NEGATIVE Final    Comment: (NOTE) SARS-CoV-2 target nucleic acids are NOT DETECTED. The SARS-CoV-2 RNA is generally detectable in upper and lower respiratory specimens during the acute phase of infection. Negative results do not preclude SARS-CoV-2 infection, do not rule out co-infections with other pathogens, and should not be used as the sole basis for treatment or other patient management decisions. Negative results must be combined with clinical observations, patient history, and epidemiological information. The expected result is Negative. Fact Sheet for Patients:  HairSlick.no Fact Sheet for Healthcare Providers: quierodirigir.com This test is not yet approved or cleared by the [place] FDA and  has been authorized for detection and/or diagnosis of SARS-CoV-2 by FDA under an Emergency Use Authorization (EUA). This EUA will remain  in effect (meaning this test can be used) for the duration of the COVID-19 declaration under Section 56 4(b)(1) of the Act, 21 U.S.C. section 360bbb-3(b)(1), unless the  authorization is terminated or revoked sooner. Performed at [facility], [address], [place], [place] [address]   MRSA PCR Screening     Status: None   Collection Time: 10/23/19 12:31 AM   Specimen: Nasal Mucosa; Nasopharyngeal  Result Value Ref Range Status   MRSA by PCR NEGATIVE NEGATIVE Final    Comment:        The GeneXpert MRSA Assay (FDA approved for NASAL specimens only), is one component of a comprehensive MRSA colonization surveillance program. It is not intended to diagnose MRSA infection nor to guide or monitor treatment for MRSA infections. Performed at [facility], [address], [place], [place] [address]      Scheduled Meds: . apixaban  5 mg Oral BID  . Chlorhexidine Gluconate Cloth  6 each Topical Daily  . diltiazem  120 mg Oral ONCE-1800  . diltiazem  180 mg Oral Once  . furosemide  40 mg Oral BID  . ipratropium  0.5 mg Nebulization TID  . levalbuterol  0.63 mg Nebulization TID  . nicotine  14 mg Transdermal Daily  . terbinafine   Topical BID   Continuous Infusions: . sodium chloride Stopped (10/26/19 1413)    Procedures/Studies: US Venous Img Lower Bilateral  Result Date: 10/23/2019 CLINICAL DATA:  Chronic bilateral lower extremity pain and edema, worse during the past 1-2 weeks. EXAM: BILATERAL LOWER EXTREMITY VENOUS DOPPLER ULTRASOUND TECHNIQUE: Gray-scale sonography with graded compression, as well as color Doppler and duplex ultrasound were performed to evaluate the lower extremity deep venous systems from the level of the common femoral vein and including the common femoral, femoral, profunda femoral, popliteal and calf veins including the posterior tibial, peroneal and gastrocnemius veins when visible. The superficial great saphenous vein was also interrogated. Spectral Doppler was utilized to evaluate flow at rest and with distal augmentation maneuvers in the common femoral, femoral and popliteal veins. COMPARISON:  None. FINDINGS:  Examination is degraded due to patient body habitus and poor sonographic window. RIGHT LOWER EXTREMITY Common Femoral Vein: No evidence of thrombus. Normal compressibility, respiratory phasicity and response to augmentation. Saphenofemoral Junction: No evidence of thrombus. Normal compressibility and flow on color Doppler imaging. Profunda Femoral Vein: No evidence of thrombus. Normal compressibility and flow on color Doppler imaging. Femoral Vein: No evidence of thrombus. Normal compressibility, respiratory phasicity and response to augmentation. Popliteal Vein: No evidence of thrombus. Normal compressibility, respiratory phasicity and response to augmentation. Calf Veins: No evidence of thrombus. Normal compressibility and flow on color Doppler imaging. Superficial Great Saphenous Vein: No evidence of thrombus. Normal compressibility. Venous Reflux:  None. Other Findings:  None. LEFT LOWER EXTREMITY Common Femoral Vein: No evidence of thrombus. Normal compressibility, respiratory phasicity and response to augmentation. Saphenofemoral Junction: No evidence of thrombus. Normal compressibility and flow on color Doppler imaging. Profunda Femoral Vein: No evidence of thrombus. Normal compressibility and flow on color Doppler imaging. Femoral Vein: No evidence of thrombus. Normal compressibility, respiratory phasicity and response to augmentation. Popliteal Vein: No evidence of thrombus. Normal compressibility, respiratory phasicity and response to augmentation. Calf Veins: Appear  patent where imaged. Superficial Great Saphenous Vein: No evidence of thrombus. Normal compressibility. Venous Reflux:  None. Other Findings:  None. IMPRESSION: No evidence of DVT within either lower extremity. Electronically Signed   By: Sandi Mariscal M.D.   On: 10/23/2019 13:02   Dg Chest Port 1 View  Result Date: 10/22/2019 CLINICAL DATA:  Shortness of breath EXAM: PORTABLE CHEST 1 VIEW COMPARISON:  None. FINDINGS: Lungs are clear. There  is cardiomegaly with pulmonary vascularity within normal limits. No adenopathy. No bone lesions. IMPRESSION: Cardiomegaly.  No edema or consolidation. Electronically Signed   By: Lowella Grip III M.D.   On: 10/22/2019 15:52    Hendrixx Severin Denton Brick, MD  [facility]  If 7PM-7AM, please contact night-coverage [email or web address] Password [email or web address] 10/28/2019, 9:31 AM   LOS: 6 days

## 2019-10-29 ENCOUNTER — Encounter (HOSPITAL_COMMUNITY)
Admission: EM | Disposition: A | Discharge status: Home / Self Care | Payer: Self-pay | Source: Home / Self Care | Attending: Family Medicine

## 2019-10-29 ENCOUNTER — Encounter (HOSPITAL_COMMUNITY): Payer: Self-pay | Admitting: Emergency Medicine

## 2019-10-29 ENCOUNTER — Inpatient Hospital Stay (HOSPITAL_COMMUNITY): Payer: Self-pay | Admitting: Anesthesiology

## 2019-10-29 ENCOUNTER — Inpatient Hospital Stay (HOSPITAL_COMMUNITY): Payer: Self-pay

## 2019-10-29 ENCOUNTER — Other Ambulatory Visit: Payer: Self-pay

## 2019-10-29 DIAGNOSIS — I480 Paroxysmal atrial fibrillation: Secondary | ICD-10-CM

## 2019-10-29 HISTORY — PX: TEE WITHOUT CARDIOVERSION: SHX5443

## 2019-10-29 HISTORY — PX: CARDIOVERSION: SHX1299

## 2019-10-29 LAB — RENAL FUNCTION PANEL
Albumin: 3.4 g/dL — ABNORMAL LOW (ref 3.5–5.0)
Anion gap: 11 (ref 5–15)
BUN: 20 mg/dL (ref 8–23)
CO2: 29 mmol/L (ref 22–32)
Calcium: 8.9 mg/dL (ref 8.9–10.3)
Chloride: 96 mmol/L — ABNORMAL LOW (ref 98–111)
Creatinine, Ser: 1.02 mg/dL (ref 0.61–1.24)
GFR calc Af Amer: >60 mL/min (ref >60)
GFR calc non Af Amer: >60 mL/min (ref >60)
Glucose, Bld: 111 mg/dL — ABNORMAL HIGH (ref 70–99)
Phosphorus: 3.4 mg/dL (ref 2.5–4.6)
Potassium: 3.6 mmol/L (ref 3.5–5.1)
Sodium: 136 mmol/L (ref 135–145)

## 2019-10-29 LAB — MAGNESIUM: Magnesium: 2.4 mg/dL (ref 1.7–2.4)

## 2019-10-29 SURGERY — ECHOCARDIOGRAM, TRANSESOPHAGEAL
Anesthesia: General

## 2019-10-29 MED ORDER — AMIODARONE IV BOLUS ONLY 150 MG/100ML
150.0000 mg | Freq: Once | INTRAVENOUS | Status: AC
  Administered 2019-10-29: 10:00:00 150 mg via INTRAVENOUS
  Filled 2019-10-29: qty 100

## 2019-10-29 MED ORDER — KETAMINE HCL 50 MG/5ML IJ SOSY
PREFILLED_SYRINGE | INTRAMUSCULAR | Status: AC
  Filled 2019-10-29: qty 5

## 2019-10-29 MED ORDER — LACTATED RINGERS IV SOLN
Freq: Once | INTRAVENOUS | Status: AC
  Administered 2019-10-29: 09:00:00 via INTRAVENOUS

## 2019-10-29 MED ORDER — KETAMINE HCL 10 MG/ML IJ SOLN
INTRAMUSCULAR | Status: DC | PRN
  Administered 2019-10-29 (×3): 10 mg via INTRAVENOUS

## 2019-10-29 MED ORDER — LACTATED RINGERS IV SOLN
INTRAVENOUS | Status: DC | PRN
  Administered 2019-10-29: 09:00:00 via INTRAVENOUS

## 2019-10-29 MED ORDER — DILTIAZEM HCL ER COATED BEADS 120 MG PO CP24
120.0000 mg | ORAL_CAPSULE | Freq: Every day | ORAL | Status: DC
  Administered 2019-10-29 – 2019-10-30 (×2): 120 mg via ORAL
  Filled 2019-10-29 (×2): qty 1

## 2019-10-29 MED ORDER — PROPOFOL 500 MG/50ML IV EMUL
INTRAVENOUS | Status: DC | PRN
  Administered 2019-10-29: 100 ug/kg/min via INTRAVENOUS
  Administered 2019-10-29: 75 ug/kg/min via INTRAVENOUS

## 2019-10-29 MED ORDER — PROPOFOL 10 MG/ML IV BOLUS
INTRAVENOUS | Status: AC
  Filled 2019-10-29: qty 20

## 2019-10-29 MED ORDER — AMIODARONE IV BOLUS ONLY 150 MG/100ML
150.0000 mg | Freq: Once | INTRAVENOUS | Status: DC
  Filled 2019-10-29: qty 100

## 2019-10-29 MED ORDER — AMIODARONE HCL 200 MG PO TABS
200.0000 mg | ORAL_TABLET | Freq: Two times a day (BID) | ORAL | Status: DC
  Administered 2019-10-29 – 2019-10-30 (×2): 200 mg via ORAL
  Filled 2019-10-29 (×2): qty 1

## 2019-10-29 MED ORDER — PROPOFOL 10 MG/ML IV BOLUS
INTRAVENOUS | Status: DC | PRN
  Administered 2019-10-29: 20 mg via INTRAVENOUS
  Administered 2019-10-29: 40 mg via INTRAVENOUS

## 2019-10-29 NOTE — Procedures (Addendum)
TEE/Direct current cardioversion  Indication: Persistent atrial fibrillation  TEE (reported separately): 1. Normal LV systolic function, EF 37-10%. 2. No significant valvular pathology 3. No left atrial appendage thrombus  Description of procedure: After informed consent was obtained and preprocedure evaluation by Anesthesia, patient was taken to the procedure room. Timeout performed. Sedation was managed completely by the Anesthesia service, please refer to their documentation for details. Anterior and posterior chest pads were placed and connected to a biphasic defibrillator. A single synchronized shock of 150J was delivered resulting in restoration of sinus rhythm with PAC's. The patient remained hemodynamically stable throughout, and there were no immediate complications noted. Follow-up ECG obtained. 150 mg IV amiodarone given after procedure.  Plan: Will start oral amiodarone 200 mg bid x 2 weeks followed by 200 mg daily for an additional 2 weeks and then stop. Not an optimal candidate for long-term amio given lung disease/COPD. Will start Cardizem CD 120 mg daily today. Continue Eliquis 5 mg bid.  Kate Sable, M.D., F.A.C.C.

## 2019-10-29 NOTE — Progress Notes (Signed)
*  PRELIMINARY RESULTS* Echocardiogram 2D Echocardiogram has been performed.  Samuel Germany 10/29/2019, 10:22 AM

## 2019-10-29 NOTE — Progress Notes (Signed)
Electrical Cardioversion Procedure Note Kwasi Joung [id number] Jun 11, 1956  Procedure: Electrical Cardioversion Indications:  Atrial Fibrillation  Procedure Details Consent: Yes Time Out: Verified patient identification, verified procedure, site/side was marked, verified correct patient position, special equipment/implants available, medications/allergies/relevent history reviewed, required imaging and test results available. 0937  Patient placed on cardiac monitor, pulse oximetry, supplemental oxygen as necessary.  Sedation given: yes Pacer pads placed yes  Cardioverted  1 time(s).  Cardioverted at 150 joules  Evaluation Findings: Post procedure EKG shows: Normal Sinus Rhythm Complications: none Patient did tolerate procedure well.   Hillery Jacks 10/29/2019, 10:55 AM

## 2019-10-29 NOTE — Anesthesia Procedure Notes (Signed)
Date/Time: 10/29/2019 9:22 AM Performed by: Vista Deck, CRNA Pre-anesthesia Checklist: Patient identified, Emergency Drugs available, Suction available, Timeout performed and Patient being monitored Patient Re-evaluated:Patient Re-evaluated prior to induction Oxygen Delivery Method: Non-rebreather mask

## 2019-10-29 NOTE — Progress Notes (Signed)
Progress Note  Patient Name: Adam Marquez Date of Encounter: 10/29/2019  Primary Cardiologist: Carlyle Dolly, MD   Subjective   I spoke with nursing that at rest overnight HR was in 70-80 bpm range but when he got up to use urinal, HR would escalate to 130 bpm range.  The patient noticed that 2 and wondered if that was normal.  He is nervous about the procedure but is willing to proceed.  Inpatient Medications    Scheduled Meds: . apixaban  5 mg Oral BID  . Chlorhexidine Gluconate Cloth  6 each Topical Daily  . furosemide  40 mg Oral BID  . ipratropium  0.5 mg Nebulization TID  . levalbuterol  0.63 mg Nebulization TID  . nicotine  14 mg Transdermal Daily  . sodium chloride flush  3 mL Intravenous Q12H  . terbinafine   Topical BID   Continuous Infusions: . sodium chloride Stopped (10/26/19 1413)  . sodium chloride     PRN Meds: acetaminophen **OR** acetaminophen, levalbuterol, prochlorperazine, sodium chloride flush   Vital Signs    Vitals:   10/29/19 0400 10/29/19 0500 10/29/19 0600 10/29/19 0700  BP: 125/64  122/69 121/78  Pulse: 96  97 (!) 104  Resp: 15  (!) 21 19  Temp: 98.4 F (36.9 C)     TempSrc: Oral     SpO2: 93%  91% 93%  Weight:  133.8 kg    Height:        Intake/Output Summary (Last 24 hours) at 10/29/2019 0817 Last data filed at 10/29/2019 0749 Gross per 24 hour  Intake 6 ml  Output 3800 ml  Net -3794 ml   Filed Weights   10/27/19 0500 10/28/19 0500 10/29/19 0500  Weight: (!) 138.7 kg (!) 138.1 kg 133.8 kg    Telemetry    Atrial fib, 3 beats NSVT - Personally Reviewed  Physical Exam   GEN: No acute distress.   Neck: No JVD Cardiac: irregular rhythm, no murmurs, rubs, or gallops.  Respiratory: Clear to auscultation bilaterally. GI: Soft, nontender, non-distended  MS: No edema; No deformity. Neuro:  Nonfocal  Psych: Normal affect   Labs    Chemistry Recent Labs  Lab 10/22/19 1528  10/26/19 0416 10/28/19 0910  10/29/19 0441  NA 138   < > 140 138 136  K 4.2   < > 3.5 3.7 3.6  CL 97*   < > 93* 96* 96*  CO2 32   < > 33* 32 29  GLUCOSE 101*   < > 104* 136* 111*  BUN 10   < > 21 18 20   CREATININE 1.06   < > 0.95 1.02 1.02  CALCIUM 9.0   < > 9.0 9.1 8.9  PROT 6.3*  --   --   --   --   ALBUMIN 3.2*  --   --   --  3.4*  AST 19  --   --   --   --   ALT 20  --   --   --   --   ALKPHOS 84  --   --   --   --   BILITOT 1.1  --   --   --   --   GFRNONAA >60   < > >60 >60 >60  GFRAA >60   < > >60 >60 >60  ANIONGAP 9   < > 14 10 11    < > = values in this interval not displayed.  Hematology Recent Labs  Lab 10/23/19 0424 10/24/19 0402 10/26/19 0416  WBC 6.8 8.2 6.6  RBC 5.09 4.62 4.80  HGB 16.1 14.9 15.4  HCT 52.3* 47.4 48.9  MCV 102.8* 102.6* 101.9*  MCH 31.6 32.3 32.1  MCHC 30.8 31.4 31.5  RDW 15.5 15.2 15.2  PLT 171 193 202    Cardiac EnzymesNo results for input(s): TROPONINI in the last 168 hours. No results for input(s): TROPIPOC in the last 168 hours.   BNP Recent Labs  Lab 10/22/19 1528  BNP 247.0*     DDimer No results for input(s): DDIMER in the last 168 hours.   Radiology    No results found.   Patient Profile     63 y.o. male with history of hypertension and COPD admitted with atrial fibrillation and acute diastolic heart failure.  Assessment & Plan    1. New onset atrial fibrillation: Heart rates fluctuate with tachycardia with any significant activity but at times becoming bradycardic with heart rates in the 40 yesterday morning.  Heart rates had gone down to the 30 bpm range when he was on Lopressor 50 mg 3 times daily.  I switched him for metoprolol to short acting diltiazem on 10/26 given his COPD and expiratory wheezing.  He received two doses of Cardizem CD on 10/28. Will proceed with TEE/DCCV today.   We will need to monitor for bradycardia.  He has moderate left atrial dilatation.  Given his several comorbidities including COPD, sleep apnea, and  obesity, I am uncertain as to how long he would maintain sinus rhythm regardless.Continue apixaban 5 mg twice daily for systemic anticoagulation.  2. Hypertension: Blood pressure is normal on diltiazem. No changes.  3. COPD: He needs smoking cessation. He smokes about 1/2 pack of cigarettes daily. Continue bronchodilators.  4. Tobacco abuse: He needs smoking cessation. He smokes about 1/2 pack of cigarettes daily.  5.Acute diastolic heart failure: Likely secondary to rapid atrial fibrillation. He is on oral Lasix 40 mg twice daily. He has put outover 3.8 L in the last 24 hours.  Renal function is normal.    For questions or updates, please contact [facility] Please consult [email or web address] for contact info under Cardiology/STEMI.      Signed, Prentice Docker, MD  10/29/2019, 8:17 AM

## 2019-10-29 NOTE — Addendum Note (Signed)
Addendum  created 10/29/19 1103 by Vista Deck, CRNA   Flowsheet accepted, Intraprocedure Flowsheets edited

## 2019-10-29 NOTE — Anesthesia Postprocedure Evaluation (Signed)
Anesthesia Post Note  Patient: Adam Marquez  Procedure(s) Performed: TRANSESOPHAGEAL ECHOCARDIOGRAM (TEE) WITH PROPOFOL (N/A ) CARDIOVERSION WITH PROPOFOL (N/A )  Patient location during evaluation: PACU Anesthesia Type: General Level of consciousness: awake and alert, oriented and patient cooperative Pain management: pain level controlled Vital Signs Assessment: post-procedure vital signs reviewed and stable Respiratory status: spontaneous breathing Cardiovascular status: stable Postop Assessment: no apparent nausea or vomiting Anesthetic complications: no     Last Vitals:  Vitals:   10/29/19 1030 10/29/19 1045  BP: 119/81   Pulse: 73 80  Resp: 12 17  Temp:    SpO2: 98% 97%    Last Pain:  Vitals:   10/29/19 1000  TempSrc:   PainSc: 0-No pain                 [name], [name]

## 2019-10-29 NOTE — Transfer of Care (Addendum)
Immediate Anesthesia Transfer of Care Note  Patient: Adam Marquez  Procedure(s) Performed: TRANSESOPHAGEAL ECHOCARDIOGRAM (TEE) WITH PROPOFOL (N/A ) CARDIOVERSION WITH PROPOFOL (N/A )  Patient Location: PACU  Anesthesia Type:General  Level of Consciousness: awake and patient cooperative  Airway & Oxygen Therapy: Patient Spontanous Breathing and Patient connected to nasal cannula oxygen  Post-op Assessment: Report given to RN and Post -op Vital signs reviewed and stable  Post vital signs: Reviewed and stable  Last Vitals:  Vitals Value Taken Time  BP 106/67   Temp    Pulse 79   Resp 20   SpO2 96     Last Pain:  Vitals:   10/29/19 0832  TempSrc: Oral  PainSc: 0-No pain     SEE PACU FLOW SHEET FOR VITAL SIGNS    Complications: No apparent anesthesia complications

## 2019-10-29 NOTE — Anesthesia Preprocedure Evaluation (Signed)
Anesthesia Evaluation  Patient identified by MRN, date of birth, ID band Patient awake    Reviewed: Allergy & Precautions, NPO status , Patient's Chart, lab work & pertinent test results, reviewed documented beta blocker date and time   Airway Mallampati: II  TM Distance: >3 FB Neck ROM: Full    Dental  (+) Missing, Dental Advisory Given   Pulmonary COPD,  COPD inhaler, Current Smoker and Patient abstained from smoking.,           Cardiovascular Exercise Tolerance: Poor hypertension, Pt. on medications +CHF  (-) dysrhythmias Atrial Fibrillation   IMPRESSIONS    1. Left ventricular ejection fraction, by visual estimation, is 55 to 60%. The left ventricle has normal function. There is mildly increased left ventricular hypertrophy.  2. Left ventricular diastolic Doppler parameters are indeterminate pattern of LV diastolic filling.  3. Global right ventricle has low normal systolic function.The right ventricular size is normal. No increase in right ventricular wall thickness.  4. Left atrial size was moderately dilated.  5. Right atrial size was not well visualized.  6. The mitral valve was not well visualized. No evidence of mitral valve regurgitation. No evidence of mitral stenosis.  7. The tricuspid valve is not well visualized. Tricuspid valve regurgitation was not visualized by color flow Doppler.  8. The aortic valve has an indeterminant number of cusps Aortic valve regurgitation was not visualized by color flow Doppler. Structurally normal aortic valve, with no evidence of sclerosis or stenosis.  9. The pulmonic valve was not well visualized. Pulmonic valve regurgitation is not visualized by color flow Doppler. 10. The inferior vena cava is dilated in size with >50% respiratory variability, suggesting right atrial pressure of 8 mmHg. EKG: 24-Oct-2019 19:13:06 [facility]-ICU ROUTINE RECORD Atrial fibrillation  with rapid ventricular response Abnormal ECG   Neuro/Psych negative neurological ROS  negative psych ROS   GI/Hepatic negative GI ROS, (+)     substance abuse  alcohol use,   Endo/Other  negative endocrine ROS  Renal/GU negative Renal ROS  negative genitourinary   Musculoskeletal negative musculoskeletal ROS (+)   Abdominal   Peds  Hematology negative hematology ROS (+)   Anesthesia Other Findings   Reproductive/Obstetrics negative OB ROS                            Anesthesia Physical Anesthesia Plan  ASA: IV  Anesthesia Plan: General   Post-op Pain Management:    Induction: Intravenous  PONV Risk Score and Plan: TIVA  Airway Management Planned: Nasal Cannula, Natural Airway and Simple Face Mask  Additional Equipment:   Intra-op Plan:   Post-operative Plan:   Informed Consent: I have reviewed the patients History and Physical, chart, labs and discussed the procedure including the risks, benefits and alternatives for the proposed anesthesia with the patient or authorized representative who has indicated his/her understanding and acceptance.     Dental advisory given  Plan Discussed with: CRNA  Anesthesia Plan Comments:        Anesthesia Quick Evaluation

## 2019-10-29 NOTE — Progress Notes (Signed)
PROGRESS NOTE  Adam Marquez MGQ:[id number] DOB: 06/12/1956 DOA: 10/22/2019 PCP: Patient, No Pcp Per  Brief History: 63 year old male with a history of hypertension, morbid obesity, tobacco abuse, and suspected COPD presenting with progressive shortness of breath that was significantly worsened over the past 2 days. In addition, he is concerned about increasing lower extremity edema and erythema of his bilateral lower extremities for the past 2 days. The patient has not been seen a physician in approximately 1 year since he moved to [place] from [place]. He states that he has not been on any medications but previously was on antihypertensive medications.He continues to smoke 1/2 pack/day but previously smoked 1-1/2 packs x 30 years. The patient also describes orthopnea type symptoms requiring him to sleep sitting upright. He denies any fevers, chills, chest pain, coughing, hemoptysis, nausea, vomiting, direct abdominal pain. In the emergency department, the patient was afebrile and hemodynamically stable with oxygen saturations of 93% on 2 L. BMP and CBC were essentially unremarkable. BNP was 247. Chest x-ray showed cardiomegaly with mild increased interstitial markings. EKG showed atrial fibrillation with nonspecific T wave changes.  Assessment/Plan:  1)Acute diastolic CHF -Echocardiogram--EF 55-60%, dilated IVC -Treated with IV Lasix, c/n oral Lasix -Fluid balance negative -Improved overall, -Shortness of breath resolved  hypoxia has -resolved  2)New onset atrial fibrillation with RVR, type unspecified -New diagnosis -Off Cardizem drip, cardiologist discontinued metoprolol due to bradycardia and bronchospasms concerns -Continue apixaban -Discontinued IV heparin -TSH--2.139 -CHADSVASc at least 2 -Significant tachycardia with new activity with heart rate in the 130s to 150s persist, -Difficulties with rate control due to bradycardia and  bronchospasm--hence cardiology discontinued metoprolol, -Failed rate control with medication titration so underwent TEE with cardioversion on 10/29/2019 -Patient received 150 mg IV amiodarone x1 -Start amiodarone load 200 mg p.o. twice daily for 2 weeks and then 200 mg daily for 2 weeks and then stop  3)Tobacco abuse/COPD -Smoking cessation advised, continue bronchodilators -Amiodarone use limited to 1 month partly due to pulmonary concerns  4)Morbid obesity -BMI 26.71--IWPY complicates overall care  5)Social/ethics----patient full code, post cardioversion patient will need to be on anticoagulation for at least 4 weeks -Discussed with pharmacist and with social worker, patient can get Eliquis for free for the first 1 month when he leaves the hospital, however post cardioversion he will need anticoagulation for at least another month--- -patient has no prescription/insurance coverage at this time -May be able to get on prescription assistance program for Eliquis  6)Essential hypertension---BP improved with Cardizem  7)Cellulitis lower extremity--mostly resolved,  completed clindamycin -Lower extremity venous Dopplers negative for DVT  8)Tinea cruris -c/n  terbinafine cream    Disposition Plan: Home when rate control improved --Unable to discharge due to symptomatic tachycardia with minimal activity and further need for rate control/titration--- s/p  TEE with cardioversion on 10/29/2019--starting amiodarone load  Family Communication:NoFamily at bedside  Procedure:- TEE (reported separately): 1. Normal LV systolic function, EF 09-98%. 2. No significant valvular pathology 3. No left atrial appendage thrombus  Consultants:cardiology  Code Status: FULL   DVT Prophylaxis:apixaban   Procedures: As Listed in Progress Note Above  Antibiotics: clinda IV 10/23>>>10/27/2019    Subjective:  - Resting comfortably, status post cardioversion -No  fevers,  Objective: Vitals:   10/29/19 0500 10/29/19 0600 10/29/19 0700 10/29/19 0832  BP:  122/69 121/78 129/90  Pulse:  97 (!) 104 (!) 104  Resp:  (!) 21 19 20   Temp:  98.7 F (37.1 C)  TempSrc:    Oral  SpO2:  91% 93% 93%  Weight: 133.8 kg     Height:        Intake/Output Summary (Last 24 hours) at 10/29/2019 1012 Last data filed at 10/29/2019 0958 Gross per 24 hour  Intake 306 ml  Output 3800 ml  Net -3494 ml   Weight change: -4.3 kg Exam:   Physical Exam  Gen:- Awake Alert,  In no apparent distress, speaking in sentences HEENT:- [place].AT, No sclera icterus Neck-Supple Neck,No JVD,.  Lungs-no wheezing, fair air movement bilaterally  CV- S1, S2 normal, regular Abd-  +ve B.Sounds, Abd Soft, No tenderness,    Extremity/Skin:- No  edema,    Psych-affect is appropriate, oriented x3 Neuro-no new focal deficits, no tremors   Data Reviewed: Basic Metabolic Panel: Recent Labs  Lab 10/22/19 1524 10/22/19 1528 10/24/19 0402 10/26/19 0416 10/28/19 0910 10/29/19 0441  NA  --  138 139 140 138 136  K  --  4.2 4.2 3.5 3.7 3.6  CL  --  97* 96* 93* 96* 96*  CO2  --  32 32 33* 32 29  GLUCOSE  --  101* 133* 104* 136* 111*  BUN  --  CREATININE  --  1.06 1.02 0.95 1.02 1.02  CALCIUM  --  9.0 9.1 9.0 9.1 8.9  MG 2.1  --  2.1 2.0  --  2.4  PHOS 3.2  --   --   --   --  3.4   Liver Function Tests: Recent Labs  Lab 10/22/19 1528 10/29/19 0441  AST 19  --   ALT 20  --   ALKPHOS 84  --   BILITOT 1.1  --   PROT 6.3*  --   ALBUMIN 3.2* 3.4*   No results for input(s): LIPASE, AMYLASE in the last 168 hours. No results for input(s): AMMONIA in the last 168 hours. Coagulation Profile: Recent Labs  Lab 10/22/19 1524  INR 1.2   CBC: Recent Labs  Lab 10/22/19 1528 10/23/19 0424 10/24/19 0402 10/26/19 0416  WBC 6.7 6.8 8.2 6.6  NEUTROABS 4.4  --   --   --   HGB 15.8 16.1 14.9 15.4  HCT 51.1 52.3* 47.4 48.9  MCV 103.0* 102.8* 102.6* 101.9*  PLT  191 171 193 202   Cardiac Enzymes: No results for input(s): CKTOTAL, CKMB, CKMBINDEX, TROPONINI in the last 168 hours. BNP: Invalid input(s): POCBNP CBG: No results for input(s): GLUCAP in the last 168 hours. HbA1C: No results for input(s): HGBA1C in the last 72 hours. Urine analysis:    Component Value Date/Time   COLORURINE YELLOW 10/22/2019 2237   APPEARANCEUR CLEAR 10/22/2019 2237   LABSPEC 1.017 10/22/2019 2237   PHURINE 6.0 10/22/2019 2237   GLUCOSEU NEGATIVE 10/22/2019 2237   HGBUR NEGATIVE 10/22/2019 2237   BILIRUBINUR NEGATIVE 10/22/2019 2237   KETONESUR 5 (A) 10/22/2019 2237   PROTEINUR 30 (A) 10/22/2019 2237   NITRITE NEGATIVE 10/22/2019 2237   LEUKOCYTESUR NEGATIVE 10/22/2019 2237   Sepsis Labs: (procalcitonin:4,lacticidven:4) ) Recent Results (from the past 240 hour(s))  SARS CORONAVIRUS 2 (TAT 6-24 HRS) Nasopharyngeal Nasopharyngeal Swab     Status: None   Collection Time: 10/22/19  5:00 PM   Specimen: Nasopharyngeal Swab  Result Value Ref Range Status   SARS Coronavirus 2 NEGATIVE NEGATIVE Final    Comment: (NOTE) SARS-CoV-2 target nucleic acids are NOT DETECTED. The SARS-CoV-2 RNA is generally detectable in upper  and lower respiratory specimens during the acute phase of infection. Negative results do not preclude SARS-CoV-2 infection, do not rule out co-infections with other pathogens, and should not be used as the sole basis for treatment or other patient management decisions. Negative results must be combined with clinical observations, patient history, and epidemiological information. The expected result is Negative. Fact Sheet for Patients: HairSlick.nohttps://www.fda.gov/media/138098/download Fact Sheet for Healthcare Providers: quierodirigir.comhttps://www.fda.gov/media/138095/download This test is not yet approved or cleared by the [place] FDA and  has been authorized for detection and/or diagnosis of SARS-CoV-2 by FDA under an Emergency Use Authorization  (EUA). This EUA will remain  in effect (meaning this test can be used) for the duration of the COVID-19 declaration under Section 56 4(b)(1) of the Act, 21 U.S.C. section 360bbb-3(b)(1), unless the authorization is terminated or revoked sooner. Performed at [facility], [address] [place], [place] [address]   MRSA PCR Screening     Status: None   Collection Time: 10/23/19 12:31 AM   Specimen: Nasal Mucosa; Nasopharyngeal  Result Value Ref Range Status   MRSA by PCR NEGATIVE NEGATIVE Final    Comment:        The GeneXpert MRSA Assay (FDA approved for NASAL specimens only), is one component of a comprehensive MRSA colonization surveillance program. It is not intended to diagnose MRSA infection nor to guide or monitor treatment for MRSA infections. Performed at [facility], [address] [place], [place] [address]      Scheduled Meds: . amiodarone  200 mg Oral BID  . [MAR Hold] apixaban  5 mg Oral BID  . [MAR Hold] Chlorhexidine Gluconate Cloth  6 each Topical Daily  . diltiazem  120 mg Oral Daily  . [MAR Hold] furosemide  40 mg Oral BID  . [MAR Hold] ipratropium  0.5 mg Nebulization TID  . [MAR Hold] levalbuterol  0.63 mg Nebulization TID  . [MAR Hold] nicotine  14 mg Transdermal Daily  . [MAR Hold] sodium chloride flush  3 mL Intravenous Q12H  . [MAR Hold] terbinafine   Topical BID   Continuous Infusions: . sodium chloride Stopped (10/26/19 1413)  . sodium chloride    . amiodarone      Procedures/Studies: [place] Venous Img Lower Bilateral  Result Date: 10/23/2019 CLINICAL DATA:  Chronic bilateral lower extremity pain and edema, worse during the past 1-2 weeks. EXAM: BILATERAL LOWER EXTREMITY VENOUS DOPPLER ULTRASOUND TECHNIQUE: Gray-scale sonography with graded compression, as well as color Doppler and duplex ultrasound were performed to evaluate the lower extremity deep venous systems from the level of the common femoral vein and including the common  femoral, femoral, profunda femoral, popliteal and calf veins including the posterior tibial, peroneal and gastrocnemius veins when visible. The superficial great saphenous vein was also interrogated. Spectral Doppler was utilized to evaluate flow at rest and with distal augmentation maneuvers in the common femoral, femoral and popliteal veins. COMPARISON:  None. FINDINGS: Examination is degraded due to patient body habitus and poor sonographic window. RIGHT LOWER EXTREMITY Common Femoral Vein: No evidence of thrombus. Normal compressibility, respiratory phasicity and response to augmentation. Saphenofemoral Junction: No evidence of thrombus. Normal compressibility and flow on color Doppler imaging. Profunda Femoral Vein: No evidence of thrombus. Normal compressibility and flow on color Doppler imaging. Femoral Vein: No evidence of thrombus. Normal compressibility, respiratory phasicity and response to augmentation. Popliteal Vein: No evidence of thrombus. Normal compressibility, respiratory phasicity and response to augmentation. Calf Veins: No evidence of thrombus. Normal compressibility and flow  on color Doppler imaging. Superficial Great Saphenous Vein: No evidence of thrombus. Normal compressibility. Venous Reflux:  None. Other Findings:  None. LEFT LOWER EXTREMITY Common Femoral Vein: No evidence of thrombus. Normal compressibility, respiratory phasicity and response to augmentation. Saphenofemoral Junction: No evidence of thrombus. Normal compressibility and flow on color Doppler imaging. Profunda Femoral Vein: No evidence of thrombus. Normal compressibility and flow on color Doppler imaging. Femoral Vein: No evidence of thrombus. Normal compressibility, respiratory phasicity and response to augmentation. Popliteal Vein: No evidence of thrombus. Normal compressibility, respiratory phasicity and response to augmentation. Calf Veins: Appear patent where imaged. Superficial Great Saphenous Vein: No evidence of  thrombus. Normal compressibility. Venous Reflux:  None. Other Findings:  None. IMPRESSION: No evidence of DVT within either lower extremity. Electronically Signed   By: Simonne Come M.D.   On: 10/23/2019 13:02   Dg Chest Port 1 View  Result Date: 10/22/2019 CLINICAL DATA:  Shortness of breath EXAM: PORTABLE CHEST 1 VIEW COMPARISON:  None. FINDINGS: Lungs are clear. There is cardiomegaly with pulmonary vascularity within normal limits. No adenopathy. No bone lesions. IMPRESSION: Cardiomegaly.  No edema or consolidation. Electronically Signed   By: Bretta Bang III M.D.   On: 10/22/2019 15:52    Sherre Wooton Mariea Clonts, MD  [facility]  If 7PM-7AM, please contact night-coverage [email or web address] Password [email or web address] 10/29/2019, 10:12 AM   LOS: 7 days

## 2019-10-30 MED ORDER — NICOTINE 14 MG/24HR TD PT24: 14.0000 mg | MEDICATED_PATCH | Freq: Every day | TRANSDERMAL | 0 refills | Status: DC

## 2019-10-30 MED ORDER — IPRATROPIUM BROMIDE 0.02 % IN SOLN: 0.5000 mg | Freq: Four times a day (QID) | RESPIRATORY_TRACT | Status: DC | PRN

## 2019-10-30 MED ORDER — ALBUTEROL SULFATE HFA 108 (90 BASE) MCG/ACT IN AERS: 2.0000 | INHALATION_SPRAY | RESPIRATORY_TRACT | 1 refills | Status: DC | PRN

## 2019-10-30 MED ORDER — DILTIAZEM HCL ER COATED BEADS 120 MG PO CP24: 120.0000 mg | ORAL_CAPSULE | Freq: Every day | ORAL | 5 refills | Status: DC

## 2019-10-30 MED ORDER — POTASSIUM CHLORIDE ER 10 MEQ PO TBCR: 10.0000 meq | EXTENDED_RELEASE_TABLET | Freq: Every day | ORAL | 2 refills | Status: DC

## 2019-10-30 MED ORDER — AMIODARONE HCL 200 MG PO TABS: 200.0000 mg | ORAL_TABLET | ORAL | 0 refills | Status: DC

## 2019-10-30 MED ORDER — APIXABAN 5 MG PO TABS: 5.0000 mg | ORAL_TABLET | Freq: Two times a day (BID) | ORAL | 11 refills | Status: DC

## 2019-10-30 MED ORDER — FUROSEMIDE 40 MG PO TABS: 40.0000 mg | ORAL_TABLET | Freq: Every day | ORAL | 2 refills | Status: DC

## 2019-10-30 MED ORDER — ACETAMINOPHEN 325 MG PO TABS: 650.0000 mg | ORAL_TABLET | Freq: Four times a day (QID) | ORAL | 0 refills | Status: DC | PRN

## 2019-10-30 NOTE — TOC Transition Note (Addendum)
Transition of Care [facility]) - CM/SW Discharge Note   Patient Details  Name: Adam Marquez MRN: [id number] Date of Birth: Nov 16, 1956  [facility] CM/SW Contact:  Ihor Gully, LCSW Phone Number: 10/30/2019, 10:29 AM   Clinical Narrative:    Patient has appointment at [place] today at 2. Patient called yesterday and cancelled appointment as he was still hospitalized. LCSW offered to reschedule for patient and patient advised that he will reschedule because he has to see what's going on at home.  Patient provided [facility] voucher and advised how to use it (participting pharmacies and cost per medication). Patient provided Eliquis coupon for free 30 day trial.  [facility], Dr. Harl Bowie, for 11/19/2019 @ 3:40 p.m. It is documented in appointment that he will need to enroll in an Eliquis assistance program.     Barriers to Discharge: Continued Medical Work up   Patient Goals and CMS Choice Patient states their goals for this hospitalization and ongoing recovery are:: Return home      Discharge Placement                       Discharge Plan and Services In-house Referral: Clinical Social Work                                   Social Determinants of Health (SDOH) Interventions     Readmission Risk Interventions Readmission Risk Prevention Plan 10/23/2019  Post Dischage Appt Complete  Medication Screening Complete  Transportation Screening Complete

## 2019-10-30 NOTE — Discharge Instructions (Signed)
1)Take Amiodarone 200 mg as prescribed as follows ---take 1 tablet twice a day for 14 days last dose 11/13/2019 and then take 1 tablet daily for another 2 weeks through 11/27/2019 and then STOP 2) follow-up with cardiologist Dr. Harl Bowie, for 11/19/2019 @ 3:40 p----appointment has been made for you 3) you are taking apixaban/Eliquis which is a blood thinner so Avoid ibuprofen/Advil/Aleve/Motrin/Goody Powders/Naproxen/BC powders/Meloxicam/Diclofenac/Indomethacin and other Nonsteroidal anti-inflammatory medications as these will make you more likely to bleed and can cause stomach ulcers, can also cause Kidney problems.  4) take the rest of your medications as prescribed 5) you may use over-the-counter nicotine patch to help you quit smoking 6) you need to establish care with a primary care physician--- you will need your primary care physician to refer you for an outpatient sleep study as you may have obstructive sleep apnea which can contribute to irregular heartbeat and other complications   Information on my medicine - ELIQUIS (apixaban)  This medication education was reviewed with me or my healthcare representative as part of my discharge preparation.    Why was Eliquis prescribed for you? Eliquis was prescribed for you to reduce the risk of a blood clot forming that can cause a stroke if you have a medical condition called atrial fibrillation (a type of irregular heartbeat).  What do You need to know about Eliquis ? Take your Eliquis TWICE DAILY - one tablet in the morning and one tablet in the evening with or without food. If you have difficulty swallowing the tablet whole please discuss with your pharmacist how to take the medication safely.  Take Eliquis exactly as prescribed by your doctor and DO NOT stop taking Eliquis without talking to the doctor who prescribed the medication.  Stopping may increase your risk of developing a stroke.  Refill your prescription before you run  out.  After discharge, you should have regular check-up appointments with your healthcare provider that is prescribing your Eliquis.  In the future your dose may need to be changed if your kidney function or weight changes by a significant amount or as you get older.  What do you do if you miss a dose? If you miss a dose, take it as soon as you remember on the same day and resume taking twice daily.  Do not take more than one dose of ELIQUIS at the same time to make up a missed dose.  Important Safety Information A possible side effect of Eliquis is bleeding. You should call your healthcare provider right away if you experience any of the following: ? Bleeding from an injury or your nose that does not stop. ? Unusual colored urine (red or dark brown) or unusual colored stools (red or black). ? Unusual bruising for unknown reasons. ? A serious fall or if you hit your head (even if there is no bleeding).  Some medicines may interact with Eliquis and might increase your risk of bleeding or clotting while on Eliquis. To help avoid this, consult your healthcare provider or pharmacist prior to using any new prescription or non-prescription medications, including herbals, vitamins, non-steroidal anti-inflammatory drugs (NSAIDs) and supplements.  This website has more information on Eliquis (apixaban): http://www.eliquis.com/eliquis/home   1)Take Amiodarone 200 mg as prescribed as follows ---take 1 tablet twice a day for 14 days last dose 11/13/2019 and then take 1 tablet daily for another 2 weeks through 11/27/2019 and then STOP 2) follow-up with cardiologist Dr. Harl Bowie, for 11/19/2019 @ 3:40 p----appointment has been made for you 3) you  are taking apixaban/Eliquis which is a blood thinner so Avoid ibuprofen/Advil/Aleve/Motrin/Goody Powders/Naproxen/BC powders/Meloxicam/Diclofenac/Indomethacin and other Nonsteroidal anti-inflammatory medications as these will make you more likely to bleed and can  cause stomach ulcers, can also cause Kidney problems.  4) take the rest of your medications as prescribed 5) you may use over-the-counter nicotine patch to help you quit smoking 6) you need to establish care with a primary care physician--- you will need your primary care physician to refer you for an outpatient sleep study as you may have obstructive sleep apnea which can contribute to irregular heartbeat and other complications

## 2019-10-30 NOTE — Progress Notes (Signed)
Progress Note  Patient Name: Adam Marquez Date of Encounter: 10/30/2019  Primary Cardiologist: Dina Rich, MD   Subjective   S/p TEE/DCCV on 10/29.  He is doing well this morning and is grateful for his care.  He denies chest pain, palpitations, leg swelling, and shortness of breath.  Inpatient Medications    Scheduled Meds: . amiodarone  200 mg Oral BID  . apixaban  5 mg Oral BID  . Chlorhexidine Gluconate Cloth  6 each Topical Daily  . diltiazem  120 mg Oral Daily  . furosemide  40 mg Oral BID  . nicotine  14 mg Transdermal Daily  . sodium chloride flush  3 mL Intravenous Q12H  . terbinafine   Topical BID   Continuous Infusions: . sodium chloride Stopped (10/26/19 1413)  . sodium chloride     PRN Meds: acetaminophen **OR** acetaminophen, ipratropium, levalbuterol, prochlorperazine, sodium chloride flush   Vital Signs    Vitals:   10/30/19 0700 10/30/19 0737 10/30/19 0800 10/30/19 0802  BP: 137/89  120/71   Pulse: (!) 59     Resp: 12  15   Temp:  98.3 F (36.8 C)    TempSrc:  Oral    SpO2:    94%  Weight:      Height:        Intake/Output Summary (Last 24 hours) at 10/30/2019 0854 Last data filed at 10/30/2019 0500 Gross per 24 hour  Intake 400 ml  Output 1700 ml  Net -1300 ml   Filed Weights   10/28/19 0500 10/29/19 0500 10/30/19 0500  Weight: (!) 138.1 kg 133.8 kg 132.5 kg    Telemetry    Sinus rhythm- Personally Reviewed  ECG    ECG on 10/29/2019 showed sinus rhythm with sinus arrhythmia- Personally Reviewed  Physical Exam   GEN: No acute distress.   Neck: No JVD Cardiac: RRR, no murmurs, rubs, or gallops.  Respiratory: Clear to auscultation bilaterally. GI: Soft, nontender, non-distended  MS: No edema; No deformity. Neuro:  Nonfocal  Psych: Normal affect   Labs    Chemistry Recent Labs  Lab 10/26/19 0416 10/28/19 0910 10/29/19 0441  NA 140 138 136  K 3.5 3.7 3.6  CL 93* 96* 96*  CO2 33* 32 29  GLUCOSE 104*  136* 111*  BUN 21 18 20   CREATININE 0.95 1.02 1.02  CALCIUM 9.0 9.1 8.9  ALBUMIN  --   --  3.4*  GFRNONAA >60 >60 >60  GFRAA >60 >60 >60  ANIONGAP 14 10 11      Hematology Recent Labs  Lab 10/24/19 0402 10/26/19 0416  WBC 8.2 6.6  RBC 4.62 4.80  HGB 14.9 15.4  HCT 47.4 48.9  MCV 102.6* 101.9*  MCH 32.3 32.1  MCHC 31.4 31.5  RDW 15.2 15.2  PLT 193 202    Cardiac EnzymesNo results for input(s): TROPONINI in the last 168 hours. No results for input(s): TROPIPOC in the last 168 hours.   BNPNo results for input(s): BNP, PROBNP in the last 168 hours.   DDimer No results for input(s): DDIMER in the last 168 hours.   Radiology    No results found.  Cardiac Studies   TEE (10/29/19):   1. Left ventricular ejection fraction, by visual estimation, is 60 to 65%. The left ventricle has normal function. There is mildly increased left ventricular hypertrophy.  2. Left ventricular diastolic function could not be evaluated secondary to rapid atrial fibrillation.  3. Global right ventricle has low normal systolic function.The  right ventricular size is normal. Mildly increased right ventricular wall thickness.  4. Left atrial size was moderately dilated.  5. No left atrial appendage thrombus. Intracavitary velocities are normal.  6. Right atrial size was mildly dilated.  7. The mitral valve is normal in structure. No evidence of mitral valve regurgitation.  8. The tricuspid valve is normal in structure. Tricuspid valve regurgitation is mild.  9. The aortic valve is tricuspid. Aortic valve regurgitation is not visualized. No evidence of aortic valve sclerosis or stenosis. 10. The pulmonic valve was normal in structure. Pulmonic valve regurgitation is not visualized.   Patient Profile     63 y.o. male with history of hypertension and COPD admitted with atrial fibrillation and acute diastolic heart failure.  Assessment & Plan    1.  New onset rapid atrial fibrillation: Status post  TEE/direct-current cardioversion on 10/29/2019.  He is maintaining sinus rhythm.  I plan to treat him with amiodarone 200 mg twice daily for 2 weeks followed by 200 mg daily for 2 weeks and then stop it altogether.  He is not a good candidate for long-term amiodarone therapy given his history of COPD.  I encouraged him to obtain an PCP as he will need evaluation and treatment for sleep apnea which can trigger atrial fibrillation.  I have started Cardizem CD 140 mg daily.  He will be maintained on apixaban 5 mg twice daily for systemic anticoagulation.  2.  Hypertension: Blood pressure is normal.  No changes.  3. He needs smoking cessation. He smokes about 1/2 pack of cigarettes daily. Continue bronchodilators.  4. Tobacco abuse: He needs smoking cessation. He smokes about 1/2 pack of cigarettes daily.  5.Acute diastolic heart failure: Likely secondary to rapid atrial fibrillation. He is on oral Lasix 40 mg twice daily. He has put outover1.5L in the last 24 hours. Renal function is normal.  6.  Obstructive sleep apnea: He needs evaluation and treatment as an outpatient.  I told him that this is a risk factor for recurrent atrial fibrillation.   [facility] will sign off.   Medication Recommendations:  As above Other recommendations (labs, testing, etc):  As above Follow up as an outpatient:  We will arrange  For questions or updates, please contact [place] Please consult [email or web address] for contact info under Cardiology/STEMI.      Signed, Kate Sable, MD  10/30/2019, 8:54 AM

## 2019-10-30 NOTE — Progress Notes (Signed)
Patient is to be discharged home and in stable condition. VSS, see flowsheets. Patient's IV and telemetry removed, WNL.Patient given discharge instructions and verbalized understanding. All questions addressed and answered. Patient to be escorted out by staff via wheelchair upon arrival of transportation.  Celestia Khat, RN

## 2019-10-30 NOTE — Discharge Summary (Signed)
Adam Marquez, is a 63 y.o. male  DOB [birth year]  MRN [id number].  Admission date:  10/22/2019  Admitting Physician  Bobette Mo, MD  Discharge Date:  10/30/2019   Primary MD  Patient, No Pcp Per  Recommendations for primary care physician for things to follow:   - 1)Take Amiodarone 200 mg as prescribed as follows ---take 1 tablet twice a day for 14 days last dose 11/13/2019 and then take 1 tablet daily for another 2 weeks through 11/27/2019 and then STOP 2) follow-up with cardiologist Dr. Wyline Mood, for 11/19/2019 @ 3:40 p----appointment has been made for you 3) you are taking apixaban/Eliquis which is a blood thinner so Avoid ibuprofen/Advil/Aleve/Motrin/Goody Powders/Naproxen/BC powders/Meloxicam/Diclofenac/Indomethacin and other Nonsteroidal anti-inflammatory medications as these will make you more likely to bleed and can cause stomach ulcers, can also cause Kidney problems.  4) take the rest of your medications as prescribed 5) you may use over-the-counter nicotine patch to help you quit smoking 6) you need to establish care with a primary care physician--- you will need your primary care physician to refer you for an outpatient sleep study as you may have obstructive sleep apnea which can contribute to irregular heartbeat and other complications  Admission Diagnosis  Atrial fibrillation with RVR (HCC) [I48.91] Bilateral lower extremity edema [R60.0]   Discharge Diagnosis  Atrial fibrillation with RVR (HCC) [I48.91] Bilateral lower extremity edema [R60.0]    Principal Problem:   Atrial fibrillation with RVR (HCC) Active Problems:   Hypertension   COPD (chronic obstructive pulmonary disease) (HCC)   Tobacco use   Lymphedema   Acute CHF (congestive heart failure) (HCC)   Obesity, Class III, BMI 40-49.9 (morbid obesity) (HCC)   Acute diastolic CHF (congestive heart failure) (HCC)    Bilateral lower extremity edema      Past Medical History:  Diagnosis Date  . COPD (chronic obstructive pulmonary disease) (HCC)   . Eczema   . Hypertension     Past Surgical History:  Procedure Laterality Date  . ANAL FISSURE REPAIR  1980  . CARDIOVERSION N/A 10/29/2019   Procedure: CARDIOVERSION WITH PROPOFOL;  Surgeon: Laqueta Linden, MD;  Location: [facility];  Service: Cardiovascular;  Laterality: N/A;  . MOUTH SURGERY  1990  . TEE WITHOUT CARDIOVERSION N/A 10/29/2019   Procedure: TRANSESOPHAGEAL ECHOCARDIOGRAM (TEE) WITH PROPOFOL;  Surgeon: Laqueta Linden, MD;  Location: [facility];  Service: Cardiovascular;  Laterality: N/A;       HPI  from the history and physical done on the day of admission:  -Patient coming from: Home.  I have personally briefly reviewed patient's old medical records in [facility]  Chief Complaint: Bilateral redness and swelling of the legs.  HPI: Adam Marquez is a 63 y.o. male with medical history significant of COPD, eczema, hypertension, morbid obesity, tobacco use who is coming to the emergency department due to progressively worse nonpitting edema and erythema of lower extremities.  He denies fever, chills, sore throat, rhinorrhea, but complains of occasionally productive cough, frequent dyspnea  with decreased exercise tolerance and wheezing.  He has also occasional palpitations, but denies chest pain, diaphoresis, nausea or emesis, abdominal pain, diarrhea, constipation, melena or hematochezia.  No dysuria, frequency or hematuria.  Denies polyuria, polydipsia, polyphagia or blurred vision.  ED Course: Initial vital signs temperature 98.2 F, pulse 120, respiration 19, blood pressure 141/106 mmHg and O2 sat 96% on room air.  The patient was started on the Cardizem infusion after giving a 10 mg Cardizem bolus.    His urinalysis showed ketonuria 05 and proteinuria 30 mg/dL.  The rest of the UA is within normal limits.  CBC is  normal except for an elevated MCV and RDW.  B12 and folic acid levels have been requested.  PT/INR/APTT within normal limits.  BNP was 247.0 pg milliliter.  CMP shows a chloride of 97 mmol/L.  The rest of the electrolytes are within normal limits.  Glucose slightly elevated at 101 mg/dL.  Renal function was normal.  Total protein 6.3 and albumin 3.2 g/dL.  The rest of the hepatic functions are within expected range.  His chest radiograph show cardiomegaly, but no other findings       Hospital Course:        -Take Amiodarone 200 mg as prescribed as follows ---take 1 tablet twice a day for 14 days last dose 11/13/2019 and then take 1 tablet daily for another 2 weeks through 11/27/2019 and then STOP 2) follow-up with cardiologist Dr. Wyline MoodBranch, for 11/19/2019 @ 3:40 p----appointment has been made for you  Brief History: 63 year old male with a history of hypertension, morbid obesity, tobacco abuse, and suspected COPD presenting with progressive shortness of breath that was significantly worsened over the past 2 days. In addition, he is concerned about increasing lower extremity edema and erythema of his bilateral lower extremities for the past 2 days. The patient has not been seen a physician in approximately 1 year since [place]. He states that he has not been on any medications but previously was on antihypertensive medications.He continues to smoke 1/2 pack/day but previously smoked 1-1/2 packs x 30 years. The patient also describes orthopnea type symptoms requiring him to sleep sitting upright. He denies any fevers, chills, chest pain, coughing, hemoptysis, nausea, vomiting, direct abdominal pain. In the emergency department, the patient was afebrile and hemodynamically stable with oxygen saturations of 93% on 2 L. BMP and CBC were essentially unremarkable. BNP was 247. Chest x-ray showed cardiomegaly with mild increased interstitial markings. EKG showed  atrial fibrillation with nonspecific T wave changes.  Assessment/Plan:  1)Acutediastolic CHF -Echocardiogram--EF 55-60%, dilated IVC -Treated with IV Lasix,  switched to oral Lasix -Fluid balance negative -Much improved overall, -Shortness of breath resolved  hypoxia has -resolved -Post ambulation O2 sats 96 to 90% on room air  2)New onset atrial fibrillation with RVR, type unspecified -New diagnosis -Off Cardizem drip, cardiologist discontinued metoprolol due to bradycardia and bronchospasms concerns -Continue apixaban -Discontinued IV heparin -TSH--2.139 -CHADSVASc at least 2 -Difficulties with rate control due to bradycardia and bronchospasm--hence cardiology discontinued metoprolol, -Failed rate control with medication titration so underwent TEE with cardioversion on 10/29/2019 -Patient received 150 mg IV amiodarone x1 Take Amiodarone 200 mg as prescribed as follows ---take 1 tablet twice a day for 14 days last dose 11/13/2019 and then take 1 tablet daily for another 2 weeks through 11/27/2019 and then STOP  follow-up with cardiologist Dr. Wyline MoodBranch, for 11/19/2019 @ 3:40 p----appointment has been made   3)Tobacco abuse/COPD -Smoking cessation advised,  continue bronchodilators -Amiodarone use limited to 1 month partly due to pulmonary concerns  4)Morbid obesity -BMI 78.29--FAOZ complicates overall care  5)Social/ethics----patient full code, post cardioversion patient will need to be on anticoagulation for at least 4 weeks -Discussed with pharmacist and with social worker, patient can get Eliquis for free for the first 1 month when he leaves the hospital, however post cardioversion he will need anticoagulation for at least another month--- -patient has no prescription/insurance coverage at this time -May be able to get on prescription assistance program for Eliquis  6)Essential hypertension---BP improved with Cardizem  7)Cellulitis lower extremity-- resolved,   completed clindamycin -Lower extremity venous Dopplers negative for DVT  8)Tinea cruris -c/n  OTC antifungal cream   Discharge Condition: stable  Follow UP  Follow-up Information    [facility]. Go on 10/30/2019.   Why: Please arrive by 2:00 for your appointment. Please bring all requested documents with you to the appointment.  Contact information: [address] [place], [place] [address]  [phone]       Arnoldo Lenis, MD Follow up in 20 day(s).   Specialty: Cardiology Why: Thursday, November 19, 2019 @ 3:40 p.m.  Contact information: [address] [place] [address] [phone]            Consults obtained -   Diet and Activity recommendation:  As advised  Discharge Instructions    * Discharge Instructions    Amb referral to [facility]   Complete by: As directed    Call MD for:  difficulty breathing, headache or visual disturbances   Complete by: As directed    Call MD for:  persistant dizziness or light-headedness   Complete by: As directed    Call MD for:  persistant nausea and vomiting   Complete by: As directed    Call MD for:  severe uncontrolled pain   Complete by: As directed    Call MD for:  temperature >100.4   Complete by: As directed    Diet - low sodium heart healthy   Complete by: As directed    Discharge instructions   Complete by: As directed    1)Take Amiodarone 200 mg as prescribed as follows ---take 1 tablet twice a day for 14 days last dose 11/13/2019 and then take 1 tablet daily for another 2 weeks through 11/27/2019 and then STOP 2) follow-up with cardiologist Dr. Harl Bowie, for 11/19/2019 @ 3:40 p----appointment has been made for you 3) you are taking apixaban/Eliquis which is a blood thinner so Avoid ibuprofen/Advil/Aleve/Motrin/Goody Powders/Naproxen/BC powders/Meloxicam/Diclofenac/Indomethacin and other Nonsteroidal anti-inflammatory medications as these will make you more likely to bleed and can cause stomach ulcers,  can also cause Kidney problems.  4) take the rest of your medications as prescribed 5) you may use over-the-counter nicotine patch to help you quit smoking 6) you need to establish care with a primary care physician--- you will need your primary care physician to refer you for an outpatient sleep study as you may have obstructive sleep apnea which can contribute to irregular heartbeat and other complications   Increase activity slowly   Complete by: As directed         Discharge Medications     Allergies as of 10/30/2019      Reactions   Keflex [cephalexin] Hives, Nausea And Vomiting      Medication List    TAKE these medications   acetaminophen 325 MG tablet Commonly known as: TYLENOL Take 2 tablets (650 mg total) by mouth every  6 (six) hours as needed for mild pain, fever or headache (or Fever >/= 101).   albuterol 108 (90 Base) MCG/ACT inhaler Commonly known as: VENTOLIN HFA Inhale 2 puffs into the lungs every 4 (four) hours as needed for wheezing or shortness of breath.   amiodarone 200 MG tablet Commonly known as: PACERONE Take 1 tablet (200 mg total) by mouth See admin instructions. Take 1 tablet twice a day for 14 days last dose 11/13/2019 and then take 1 tablet daily for another 2 weeks through 11/27/2019 and then stop   apixaban 5 MG Tabs tablet Commonly known as: ELIQUIS Take 1 tablet (5 mg total) by mouth 2 (two) times daily.   Clear Eyes All Seasons 5-6 MG/ML Soln Generic drug: Polyvinyl Alcohol-Povidone Apply 1 drop to eye daily as needed (for allergies).   diltiazem 120 MG 24 hr capsule Commonly known as: CARDIZEM CD Take 1 capsule (120 mg total) by mouth daily. For Heart Start taking on: October 31, 2019   furosemide 40 MG tablet Commonly known as: LASIX Take 1 tablet (40 mg total) by mouth daily.   loratadine 10 MG tablet Commonly known as: CLARITIN Take 10 mg by mouth daily as needed for allergies.   nicotine 14 mg/24hr patch Commonly known as:  NICODERM CQ - dosed in mg/24 hours Place 1 patch (14 mg total) onto the skin daily. Start taking on: October 31, 2019   potassium chloride 10 MEQ tablet Commonly known as: KLOR-CON Take 1 tablet (10 mEq total) by mouth daily.       Major procedures and Radiology Reports - PLEASE review detailed and final reports for all details, in brief -    US Venous Img Lower Bilateral  Result Date: 10/23/2019 CLINICAL DATA:  Chronic bilateral lower extremity pain and edema, worse during the past 1-2 weeks. EXAM: BILATERAL LOWER EXTREMITY VENOUS DOPPLER ULTRASOUND TECHNIQUE: Gray-scale sonography with graded compression, as well as color Doppler and duplex ultrasound were performed to evaluate the lower extremity deep venous systems from the level of the common femoral vein and including the common femoral, femoral, profunda femoral, popliteal and calf veins including the posterior tibial, peroneal and gastrocnemius veins when visible. The superficial great saphenous vein was also interrogated. Spectral Doppler was utilized to evaluate flow at rest and with distal augmentation maneuvers in the common femoral, femoral and popliteal veins. COMPARISON:  None. FINDINGS: Examination is degraded due to patient body habitus and poor sonographic window. RIGHT LOWER EXTREMITY Common Femoral Vein: No evidence of thrombus. Normal compressibility, respiratory phasicity and response to augmentation. Saphenofemoral Junction: No evidence of thrombus. Normal compressibility and flow on color Doppler imaging. Profunda Femoral Vein: No evidence of thrombus. Normal compressibility and flow on color Doppler imaging. Femoral Vein: No evidence of thrombus. Normal compressibility, respiratory phasicity and response to augmentation. Popliteal Vein: No evidence of thrombus. Normal compressibility, respiratory phasicity and response to augmentation. Calf Veins: No evidence of thrombus. Normal compressibility and flow on color Doppler  imaging. Superficial Great Saphenous Vein: No evidence of thrombus. Normal compressibility. Venous Reflux:  None. Other Findings:  None. LEFT LOWER EXTREMITY Common Femoral Vein: No evidence of thrombus. Normal compressibility, respiratory phasicity and response to augmentation. Saphenofemoral Junction: No evidence of thrombus. Normal compressibility and flow on color Doppler imaging. Profunda Femoral Vein: No evidence of thrombus. Normal compressibility and flow on color Doppler imaging. Femoral Vein: No evidence of thrombus. Normal compressibility, respiratory phasicity and response to augmentation. Popliteal Vein: No evidence of thrombus. Normal compressibility, respiratory phasicity  and response to augmentation. Calf Veins: Appear patent where imaged. Superficial Great Saphenous Vein: No evidence of thrombus. Normal compressibility. Venous Reflux:  None. Other Findings:  None. IMPRESSION: No evidence of DVT within either lower extremity. Electronically Signed   By: Simonne Come M.D.   On: 10/23/2019 13:02   Dg Chest Port 1 View  Result Date: 10/22/2019 CLINICAL DATA:  Shortness of breath EXAM: PORTABLE CHEST 1 VIEW COMPARISON:  None. FINDINGS: Lungs are clear. There is cardiomegaly with pulmonary vascularity within normal limits. No adenopathy. No bone lesions. IMPRESSION: Cardiomegaly.  No edema or consolidation. Electronically Signed   By: Bretta Bang III M.D.   On: 10/22/2019 15:52    Micro Results    Recent Results (from the past 240 hour(s))  SARS CORONAVIRUS 2 (TAT 6-24 HRS) Nasopharyngeal Nasopharyngeal Swab     Status: None   Collection Time: 10/22/19  5:00 PM   Specimen: Nasopharyngeal Swab  Result Value Ref Range Status   SARS Coronavirus 2 NEGATIVE NEGATIVE Final    Comment: (NOTE) SARS-CoV-2 target nucleic acids are NOT DETECTED. The SARS-CoV-2 RNA is generally detectable in upper and lower respiratory specimens during the acute phase of infection. Negative results do not  preclude SARS-CoV-2 infection, do not rule out co-infections with other pathogens, and should not be used as the sole basis for treatment or other patient management decisions. Negative results must be combined with clinical observations, patient history, and epidemiological information. The expected result is Negative. Fact Sheet for Patients: HairSlick.no Fact Sheet for Healthcare Providers: quierodirigir.com This test is not yet approved or cleared by the [place] FDA and  has been authorized for detection and/or diagnosis of SARS-CoV-2 by FDA under an Emergency Use Authorization (EUA). This EUA will remain  in effect (meaning this test can be used) for the duration of the COVID-19 declaration under Section 56 4(b)(1) of the Act, 21 U.S.C. section 360bbb-3(b)(1), unless the authorization is terminated or revoked sooner. Performed at [facility], [address], [place], [place] [address]   MRSA PCR Screening     Status: None   Collection Time: 10/23/19 12:31 AM   Specimen: Nasal Mucosa; Nasopharyngeal  Result Value Ref Range Status   MRSA by PCR NEGATIVE NEGATIVE Final    Comment:        The GeneXpert MRSA Assay (FDA approved for NASAL specimens only), is one component of a comprehensive MRSA colonization surveillance program. It is not intended to diagnose MRSA infection nor to guide or monitor treatment for MRSA infections. Performed at [facility], [address] [place], [place] [address]        Today   Subjective    Adam Marquez today has no new complaints, -Ambulating around without dyspnea on exertion, O2 sats post ambulation in the high 90s -No chest pains no palpitations no dizziness          Patient has been seen and examined prior to discharge   Objective   Blood pressure 120/71, pulse (!) 59, temperature 98.3 F (36.8 C), temperature source Oral, resp. rate 15, height 6' (1.829  m), weight 132.5 kg, SpO2 94 %.   Intake/Output Summary (Last 24 hours) at 10/30/2019 1158 Last data filed at 10/30/2019 0500 Gross per 24 hour  Intake 100 ml  Output 1700 ml  Net -1600 ml    Exam Gen:- Awake Alert, no acute distress  HEENT:- .AT, No sclera icterus Neck-Supple Neck,No JVD,.  Lungs-  CTAB , good air movement bilaterally  CV- S1, S2 normal, regular Abd-  +ve B.Sounds, Abd Soft, No tenderness,    Extremity/Skin:- No  edema,   good pulses Psych-affect is appropriate, oriented x3 Neuro-no new focal deficits, no tremors    Data Review   CBC w Diff:  Lab Results  Component Value Date   WBC 6.6 10/26/2019   HGB 15.4 10/26/2019   HCT 48.9 10/26/2019   PLT 202 10/26/2019   LYMPHOPCT 23 10/22/2019   MONOPCT 9 10/22/2019   EOSPCT 2 10/22/2019   BASOPCT 1 10/22/2019    CMP:  Lab Results  Component Value Date   NA 136 10/29/2019   K 3.6 10/29/2019   CL 96 (L) 10/29/2019   CO2 29 10/29/2019   BUN 20 10/29/2019   CREATININE 1.02 10/29/2019   PROT 6.3 (L) 10/22/2019   ALBUMIN 3.4 (L) 10/29/2019   BILITOT 1.1 10/22/2019   ALKPHOS 84 10/22/2019   AST 19 10/22/2019   ALT 20 10/22/2019  .   Total Discharge time is about 33 minutes  Shon Hale M.D on 10/30/2019 at 11:58 AM  Go to [email or web address] -  for contact info  [facility] - Office  [phone]

## 2019-11-19 ENCOUNTER — Ambulatory Visit (INDEPENDENT_AMBULATORY_CARE_PROVIDER_SITE_OTHER): Payer: Self-pay | Admitting: Cardiology

## 2019-11-19 ENCOUNTER — Other Ambulatory Visit: Payer: Self-pay

## 2019-11-19 ENCOUNTER — Encounter: Payer: Self-pay | Admitting: Cardiology

## 2019-11-19 VITALS — BP 165/91 | HR 70 | Temp 97.3°F | Ht 72.0 in | Wt 298.0 lb

## 2019-11-19 DIAGNOSIS — I4891 Unspecified atrial fibrillation: Secondary | ICD-10-CM

## 2019-11-19 DIAGNOSIS — G4730 Sleep apnea, unspecified: Secondary | ICD-10-CM

## 2019-11-19 DIAGNOSIS — I5032 Chronic diastolic (congestive) heart failure: Secondary | ICD-10-CM

## 2019-11-19 MED ORDER — FUROSEMIDE 20 MG PO TABS: ORAL_TABLET | ORAL | 3 refills | Status: DC

## 2019-11-19 NOTE — Progress Notes (Signed)
Clinical Summary Adam Marquez is a 63 y.o.male seen today for follow up of the following medical problems.   1. Afib - new diagnosis during 10/2019 admission - difficult to control during the admission, ultimately needed TEE/DCCV on 10/29/19 - was started on amio with plans for short course (at d/c was to have amio 200mg  bid x 2 weeks, then 200mg  daily x 2 weeks, then stop).  - startd on eliquis for stroke prevention  - no recent palpitations - home heart rates 70s-80s     2.Chronic diastolic heart failure - diuresed during recent admission - home scale 285, our scale 298 lbs. Hospital was 291 lbs.  - some SOB with walking to mailbox and back which is chronic.   3. Suspected OSA - has not been test  4. Dizzy spells - recent orthostatic symptoms, mild dizziness with standing.    5. Suspected COPD - 40 year smoking history, has not had formal diagnosis by PFTs    Past Medical History:  Diagnosis Date  . COPD (chronic obstructive pulmonary disease) ([place])   . Eczema   . Hypertension      Allergies  Allergen Reactions  . Keflex [Cephalexin] Hives and Nausea And Vomiting     Current Outpatient Medications  Medication Sig Dispense Refill  . acetaminophen (TYLENOL) 325 MG tablet Take 2 tablets (650 mg total) by mouth every 6 (six) hours as needed for mild pain, fever or headache (or Fever >/= 101). 12 tablet 0  . albuterol (VENTOLIN HFA) 108 (90 Base) MCG/ACT inhaler Inhale 2 puffs into the lungs every 4 (four) hours as needed for wheezing or shortness of breath. 18 g 1  . amiodarone (PACERONE) 200 MG tablet Take 200 mg by mouth daily.    [name] Kitchen apixaban (ELIQUIS) 5 MG TABS tablet Take 1 tablet (5 mg total) by mouth 2 (two) times daily. 60 tablet 11  . diltiazem (CARDIZEM CD) 120 MG 24 hr capsule Take 1 capsule (120 mg total) by mouth daily. For Heart 30 capsule 5  . furosemide (LASIX) 40 MG tablet Take 1 tablet (40 mg total) by mouth daily. 30 tablet 2  . loratadine  (CLARITIN) 10 MG tablet Take 10 mg by mouth daily as needed for allergies.    . nicotine (NICODERM CQ - DOSED IN MG/24 HOURS) 14 mg/24hr patch Place 1 patch (14 mg total) onto the skin daily. 28 patch 0  . Polyvinyl Alcohol-Povidone (CLEAR EYES ALL SEASONS) 5-6 MG/ML SOLN Apply 1 drop to eye daily as needed (for allergies).    . potassium chloride (KLOR-CON) 10 MEQ tablet Take 1 tablet (10 mEq total) by mouth daily. 30 tablet 2   No current facility-administered medications for this visit.      Past Surgical History:  Procedure Laterality Date  . ANAL FISSURE REPAIR  1980  . CARDIOVERSION N/A 10/29/2019   Procedure: CARDIOVERSION WITH PROPOFOL;  Surgeon: Herminio Commons, MD;  Location: [facility];  Service: Cardiovascular;  Laterality: N/A;  . [place]  . TEE WITHOUT CARDIOVERSION N/A 10/29/2019   Procedure: TRANSESOPHAGEAL ECHOCARDIOGRAM (TEE) WITH PROPOFOL;  Surgeon: Herminio Commons, MD;  Location: [facility];  Service: Cardiovascular;  Laterality: N/A;     Allergies  Allergen Reactions  . Keflex [Cephalexin] Hives and Nausea And Vomiting      Family History  Problem Relation Age of Onset  . Lung cancer Mother   . Heart disease Father      Social History Adam Marquez  reports that he has been smoking. He has been smoking about 0.50 packs per day. He has never used smokeless tobacco. Adam Marquez reports current alcohol use.   Review of Systems CONSTITUTIONAL: No weight loss, fever, chills, weakness or fatigue.  HEENT: Eyes: No visual loss, blurred vision, double vision or yellow sclerae.No hearing loss, sneezing, congestion, runny nose or sore throat.  SKIN: No rash or itching.  CARDIOVASCULAR: per hpi RESPIRATORY: No shortness of breath, cough or sputum.  GASTROINTESTINAL: No anorexia, nausea, vomiting or diarrhea. No abdominal pain or blood.  GENITOURINARY: No burning on urination, no polyuria NEUROLOGICAL: No headache, dizziness, syncope, paralysis,  ataxia, numbness or tingling in the extremities. No change in bowel or bladder control.  MUSCULOSKELETAL: No muscle, back pain, joint pain or stiffness.  LYMPHATICS: No enlarged nodes. No history of splenectomy.  PSYCHIATRIC: No history of depression or anxiety.  ENDOCRINOLOGIC: No reports of sweating, cold or heat intolerance. No polyuria or polydipsia.  [name] Kitchen.   Physical Examination Vitals:   11/19/19 1533  BP: (!) 165/91  Pulse: 70  Temp: (!) 97.3 F (36.3 C)  SpO2: 98%   Filed Weights   11/19/19 1533  Weight: 298 lb (135.2 kg)    Gen: resting comfortably, no acute distress HEENT: no scleral icterus, pupils equal round and reactive, no palptable cervical adenopathy,  CV: RRR, no m/r/g, no jvd Resp: Clear to auscultation bilaterally GI: abdomen is soft, non-tender, non-distended, normal bowel sounds, no hepatosplenomegaly MSK: extremities are warm, no edema.  Skin: warm, no rash Neuro:  no focal deficits Psych: appropriate affect   Diagnostic Studies  10/2019 echo IMPRESSIONS    1. Left ventricular ejection fraction, by visual estimation, is 55 to 60%. The left ventricle has normal function. There is mildly increased left ventricular hypertrophy.  2. Left ventricular diastolic Doppler parameters are indeterminate pattern of LV diastolic filling.  3. Global right ventricle has low normal systolic function.The right ventricular size is normal. No increase in right ventricular wall thickness.  4. Left atrial size was moderately dilated.  5. Right atrial size was not well visualized.  6. The mitral valve was not well visualized. No evidence of mitral valve regurgitation. No evidence of mitral stenosis.  7. The tricuspid valve is not well visualized. Tricuspid valve regurgitation was not visualized by color flow Doppler.  8. The aortic valve has an indeterminant number of cusps Aortic valve regurgitation was not visualized by color flow Doppler. Structurally normal aortic  valve, with no evidence of sclerosis or stenosis.  9. The pulmonic valve was not well visualized. Pulmonic valve regurgitation is not visualized by color flow Doppler. 10. The inferior vena cava is dilated in size with >50% respiratory variability, suggesting right atrial pressure of 8 mmHg.   10/2019 TEE IMPRESSIONS    1. Left ventricular ejection fraction, by visual estimation, is 60 to 65%. The left ventricle has normal function. There is mildly increased left ventricular hypertrophy.  2. Left ventricular diastolic function could not be evaluated secondary to rapid atrial fibrillation.  3. Global right ventricle has low normal systolic function.The right ventricular size is normal. Mildly increased right ventricular wall thickness.  4. Left atrial size was moderately dilated.  5. No left atrial appendage thrombus. Intracavitary velocities are normal.  6. Right atrial size was mildly dilated.  7. The mitral valve is normal in structure. No evidence of mitral valve regurgitation.  8. The tricuspid valve is normal in structure. Tricuspid valve regurgitation is mild.  9. The aortic valve  is tricuspid. Aortic valve regurgitation is not visualized. No evidence of aortic valve sclerosis or stenosis. 10. The pulmonic valve was normal in structure. Pulmonic valve regurgitation is not visualized.  Assessment and Plan   1. PAF - EKG today shows SR, no recent symptoms - continue current meds, he will complete his amio at the end of this month as only a short course was planned when initiated  2. Suspected OSA - refer to Dr Juanetta Gosling for evaluation  3. Orthostatic dizziness - lower lasix to 20mg  daily, may take additional 20mg  as needed  4. Chronic diastolic HF - appears euvolemic by exam, based on orthostatic symptos suspect perhaps over diuresed - lower lasix as reported above  5. SOB - chronic and stable. I think his cardiac issues are doing well, if progression of symptoms would pursue  PFTs given his long smoking history. He only has a rescue inhaler at this time.      , M.D.

## 2019-11-19 NOTE — Patient Instructions (Signed)
Medication Instructions:  DECREASE LASIX TO 20 MG DAILY, YOU MAY TAKE AN ADDITIONAL 20 MG DAILY AS NEEDED FOR SWELLING  Labwork: NONE  Testing/Procedures: NONE  Follow-Up: Your physician recommends that you schedule a follow-up appointment in: 3 MONTHS    Any Other Special Instructions Will Be Listed Below (If Applicable).  You have been referred to DR. HAWKINS FOR SLEEP [place]     If you need a refill on your cardiac medications before your next appointment, please call your pharmacy.

## 2019-12-01 ENCOUNTER — Telehealth: Payer: Self-pay

## 2019-12-01 NOTE — Telephone Encounter (Signed)
pt. eligibility for [facility] is 12/01/2019 till 11/30/2020. Faxed over pt. [facility] and supporting docs.   Adam Marquez

## 2019-12-16 ENCOUNTER — Encounter: Payer: Self-pay | Admitting: Physician Assistant

## 2019-12-16 ENCOUNTER — Ambulatory Visit: Payer: Self-pay | Admitting: Physician Assistant

## 2019-12-16 ENCOUNTER — Other Ambulatory Visit (HOSPITAL_COMMUNITY)
Admission: RE | Admit: 2019-12-16 | Discharge: 2019-12-16 | Disposition: A | Discharge status: Home / Self Care | Payer: Self-pay | Source: Ambulatory Visit | Attending: Physician Assistant | Admitting: Physician Assistant

## 2019-12-16 ENCOUNTER — Other Ambulatory Visit: Payer: Self-pay

## 2019-12-16 ENCOUNTER — Ambulatory Visit (HOSPITAL_COMMUNITY)
Admission: RE | Admit: 2019-12-16 | Discharge: 2019-12-16 | Disposition: A | Discharge status: Home / Self Care | Payer: Self-pay | Source: Ambulatory Visit | Attending: Physician Assistant | Admitting: Physician Assistant

## 2019-12-16 VITALS — BP 186/108 | HR 76 | Temp 98.1°F

## 2019-12-16 DIAGNOSIS — M25571 Pain in right ankle and joints of right foot: Secondary | ICD-10-CM | POA: Insufficient documentation

## 2019-12-16 DIAGNOSIS — W19XXXA Unspecified fall, initial encounter: Secondary | ICD-10-CM | POA: Insufficient documentation

## 2019-12-16 DIAGNOSIS — I10 Essential (primary) hypertension: Secondary | ICD-10-CM

## 2019-12-16 DIAGNOSIS — Z8679 Personal history of other diseases of the circulatory system: Secondary | ICD-10-CM

## 2019-12-16 DIAGNOSIS — S82831A Other fracture of upper and lower end of right fibula, initial encounter for closed fracture: Secondary | ICD-10-CM | POA: Insufficient documentation

## 2019-12-16 DIAGNOSIS — I499 Cardiac arrhythmia, unspecified: Secondary | ICD-10-CM

## 2019-12-16 DIAGNOSIS — R7303 Prediabetes: Secondary | ICD-10-CM

## 2019-12-16 DIAGNOSIS — Z7689 Persons encountering health services in other specified circumstances: Secondary | ICD-10-CM

## 2019-12-16 DIAGNOSIS — I5032 Chronic diastolic (congestive) heart failure: Secondary | ICD-10-CM

## 2019-12-16 LAB — RENAL FUNCTION PANEL
Albumin: 4 g/dL (ref 3.5–5.0)
Anion gap: 13 (ref 5–15)
BUN: 8 mg/dL (ref 8–23)
CO2: 30 mmol/L (ref 22–32)
Calcium: 9.4 mg/dL (ref 8.9–10.3)
Chloride: 101 mmol/L (ref 98–111)
Creatinine, Ser: 0.91 mg/dL (ref 0.61–1.24)
GFR calc Af Amer: >60 mL/min (ref >60)
GFR calc non Af Amer: >60 mL/min (ref >60)
Glucose, Bld: 116 mg/dL — ABNORMAL HIGH (ref 70–99)
Phosphorus: 3.3 mg/dL (ref 2.5–4.6)
Potassium: 4.6 mmol/L (ref 3.5–5.1)
Sodium: 144 mmol/L (ref 135–145)

## 2019-12-16 LAB — MAGNESIUM: Magnesium: 1.9 mg/dL (ref 1.7–2.4)

## 2019-12-16 MED ORDER — LOSARTAN POTASSIUM 100 MG PO TABS: 100.0000 mg | ORAL_TABLET | Freq: Every day | ORAL | 0 refills | Status: DC

## 2019-12-16 MED ORDER — LOSARTAN POTASSIUM 100 MG PO TABS: 100.0000 mg | ORAL_TABLET | Freq: Every day | ORAL | 1 refills | Status: DC

## 2019-12-16 NOTE — Progress Notes (Signed)
BP (!) 186/108   Pulse 76   Temp 98.1 F (36.7 C)   SpO2 98%    Subjective:    Patient ID: Adam Marquez, male    DOB: [birth year], 63 y.o.   MRN: [id number]  HPI: Adam Marquez is a 63 y.o. male presenting on 12/16/2019 for New Patient (Initial Visit) (pt moved to [facility] from [place] in Feb 2020. pt states he last saw PCP in [place].) and Ankle Injury (one week. pt states was going down basement stairs and fell and "rolled his ankle" about a week ago. pt states he has taken tylenol for pain and has been applying ice.)   HPI  Pt had negative covid 19 screening questionnaire  Chief Complaint  Patient presents with  . New Patient (Initial Visit)    pt moved to [facility] from [place] in Feb 2020. pt states he last saw PCP in [place].  . Ankle Injury    one week. pt states was going down basement stairs and fell and "rolled his ankle" about a week ago. pt states he has taken tylenol for pain and has been applying ice.    Pt presents to office today to establish care as a new pt.  Pt moved to the area from [place] after losing his job there and hoping to get a job here.  He has not yet gotten a job here.  He is currently living with his brother  Pt was admitted to [facility] in October with new onset AFib.  Pt Saw dr Harl Bowie (cardiology) for follow up  in November-    Labs done in october while inpatient reviewed  Wt today > 300lb    Relevant past medical, surgical, family and social history reviewed and updated as indicated. Interim medical history since our last visit reviewed. Allergies and medications reviewed and updated.    Current Outpatient Medications:  .  albuterol (VENTOLIN HFA) 108 (90 Base) MCG/ACT inhaler, Inhale 2 puffs into the lungs every 4 (four) hours as needed for wheezing or shortness of breath., Disp: 18 g, Rfl: 1 .  hydrocortisone cream 1 %, Apply 1 application topically 2 (two) times daily., Disp: , Rfl:  .  nicotine (NICODERM CQ -  DOSED IN MG/24 HOURS) 14 mg/24hr patch, Place 1 patch (14 mg total) onto the skin daily., Disp: 28 patch, Rfl: 0 .  oxymetazoline (AFRIN) 0.05 % nasal spray, Place 2 sprays into both nostrils as needed for congestion., Disp: , Rfl:  .  Polyvinyl Alcohol-Povidone (CLEAR EYES ALL SEASONS) 5-6 MG/ML SOLN, Apply 1 drop to eye daily as needed (for allergies)., Disp: , Rfl:    Review of Systems  Per HPI unless specifically indicated above     Objective:    BP (!) 186/108   Pulse 76   Temp 98.1 F (36.7 C)   SpO2 98%   Wt Readings from Last 3 Encounters:  11/19/19 298 lb (135.2 kg)  10/30/19 292 lb 1.8 oz (132.5 kg)    Physical Exam Vitals reviewed.  Constitutional:      General: He is not in acute distress.    Appearance: He is obese. He is not ill-appearing.  HENT:     Head: Normocephalic and atraumatic.  Eyes:     Conjunctiva/sclera: Conjunctivae normal.     Pupils: Pupils are equal, round, and reactive to light.  Cardiovascular:     Rate and Rhythm: Normal rate. Rhythm irregular.  Pulmonary:     Effort: Pulmonary effort is  normal. No respiratory distress.  Musculoskeletal:     Cervical back: Neck supple.     Right lower leg: Edema present.     Left lower leg: No edema.  Lymphadenopathy:     Cervical: No cervical adenopathy.  Skin:    General: Skin is warm and dry.  Neurological:     Mental Status: He is alert and oriented to person, place, and time.  Psychiatric:        Attention and Perception: Attention normal.        Speech: Speech normal.        Behavior: Behavior is cooperative.     EKG- sinus rhythm, long q-t      Assessment & Plan:    Encounter Diagnoses  Name Primary?  . Encounter to establish care Yes  . Essential hypertension   . Irregular heartbeat   . History of atrial fibrillation   . Acute right ankle pain   . Fall, initial encounter   . Chronic diastolic heart failure (HCC)   . Morbid obesity (HCC)   . Prediabetes       -Pt was  given application for [facility] financial assistance   -pt to Get labs, xray ankle today -pt to elevate the foot, wear ace wrap, use ice.   Will refer to orthopedics if fractured -pt given rx losartan for HTN -pt to follow-up with telemedicine appointment 3 weeks.  He is instructed to make sure he has his BP machine with him at that time.  He is to contact office sooner prn

## 2019-12-17 ENCOUNTER — Other Ambulatory Visit: Payer: Self-pay | Admitting: Physician Assistant

## 2019-12-17 DIAGNOSIS — S82891A Other fracture of right lower leg, initial encounter for closed fracture: Secondary | ICD-10-CM

## 2019-12-23 ENCOUNTER — Other Ambulatory Visit: Payer: Self-pay

## 2019-12-23 ENCOUNTER — Ambulatory Visit (INDEPENDENT_AMBULATORY_CARE_PROVIDER_SITE_OTHER): Payer: Self-pay | Admitting: Orthopaedic Surgery

## 2019-12-23 ENCOUNTER — Encounter: Payer: Self-pay | Admitting: Orthopaedic Surgery

## 2019-12-23 ENCOUNTER — Ambulatory Visit (INDEPENDENT_AMBULATORY_CARE_PROVIDER_SITE_OTHER): Payer: Self-pay

## 2019-12-23 DIAGNOSIS — S8261XA Displaced fracture of lateral malleolus of right fibula, initial encounter for closed fracture: Secondary | ICD-10-CM

## 2019-12-23 MED ORDER — HYDROCODONE-ACETAMINOPHEN 5-325 MG PO TABS: 1.0000 | ORAL_TABLET | Freq: Three times a day (TID) | ORAL | 0 refills | Status: DC | PRN

## 2019-12-23 NOTE — Progress Notes (Signed)
Office Visit Note   Patient: Adam Marquez           Date of Birth: 1956/09/21           MRN: [id number] Visit Date: 12/23/2019              Requested by: Jacquelin Hawking, PA-C [address] [place],  [place] [address] PCP: Jacquelin Hawking, PA-C   Assessment & Plan: Visit Diagnoses:  1. Displaced fracture of lateral malleolus of right fibula, initial encounter for closed fracture     Plan: Impression is minimally displaced distal fibula fracture on the right.  Due to the patient's multiple comorbidities and stability of fracture we will treat this nonoperatively for now.  He has massive swelling at this time and not appropriate for surgery anyway.  Will place him in a cam walker nonweightbearing.  He is unable to fit in the cam walker we will put him in a short leg splint.  He will ice and elevate as much as possible.  Follow-up with [place] in 2 weeks time for repeat evaluation three-view x-rays of the right ankle  Follow-Up Instructions: Return in about 2 weeks (around 01/06/2020).   Orders:  Orders Placed This Encounter  Procedures  . XR Ankle Complete Right   Meds ordered this encounter  Medications  . HYDROcodone-acetaminophen (NORCO) 5-325 MG tablet    Sig: Take 1-2 tablets by mouth 3 (three) times daily as needed for moderate pain.    Dispense:  30 tablet    Refill:  0      Procedures: No procedures performed   Clinical Data: No additional findings.   Subjective: Chief Complaint  Patient presents with  . Right Ankle - Pain    HPI patient is a pleasant 63 year old gentleman with multiple comorbidities who comes in today 2 weeks out right ankle fracture date of injury 12/09/2019.  He was referred by his PCP for x-ray of the right ankle on 12/16/2019 x-rays demonstrated a displaced distal fibula fracture.  He was told to not put much weight on it.  He notes he has been weightbearing at times and a tennis shoe.  He notes moderate pain to the medial lateral  aspects.  Worse with putting pressure on his ankle as well as with any movement of the ankle.  He denies any instability.  He has been taking Tylenol without relief of symptoms.  Of note he is anticoagulated with Eliquis.  Review of Systems as detailed in HPI.  All others reviewed and are negative.   Objective: Vital Signs: There were no vitals taken for this visit.  Physical Exam well-developed well-nourished gentleman in no acute distress.  Alert oriented x3.  Ortho Exam examination of the right ankle reveals moderate swelling.  He does have 2+ pitting edema.  Calf soft nontender.  He has moderate tenderness to the medial lateral ankle.  Pain with dorsiflexion.  Plantarflexion.  He is neurovascular intact distally.  Specialty Comments:  No specialty comments available.  Imaging: XR Ankle Complete Right  Result Date: 12/23/2019 X-rays demonstrate a minimally displaced distal fibula fracture with stable alignment of the mortise compared to previous views on 12/16/2019    PMFS History: Patient Active Problem List   Diagnosis Date Noted  . Acute diastolic CHF (congestive heart failure) (HCC) 10/24/2019  . Bilateral lower extremity edema   . Lymphedema 10/23/2019  . Acute CHF (congestive heart failure) (HCC) 10/23/2019  . Obesity, Class III, BMI 40-49.9 (morbid obesity) (HCC) 10/23/2019  .  Atrial fibrillation with RVR ([place]) 10/22/2019  . Hypertension   . COPD (chronic obstructive pulmonary disease) ([place])   . Tobacco use    Past Medical History:  Diagnosis Date  . COPD (chronic obstructive pulmonary disease) ([place])   . Diabetes mellitus without complication ([place])   . Eczema   . Hyperlipidemia   . Hypertension     Family History  Problem Relation Age of Onset  . Lung cancer Mother   . Heart disease Father   . Hypertension Father   . Diabetes Brother   . Cancer Daughter     Past Surgical History:  Procedure Laterality Date  . ANAL FISSURE REPAIR  1980  . CARDIOVERSION  N/A 10/29/2019   Procedure: CARDIOVERSION WITH PROPOFOL;  Surgeon: Herminio Commons, MD;  Location: [facility];  Service: Cardiovascular;  Laterality: N/A;  . [place]  . TEE WITHOUT CARDIOVERSION N/A 10/29/2019   Procedure: TRANSESOPHAGEAL ECHOCARDIOGRAM (TEE) WITH PROPOFOL;  Surgeon: Herminio Commons, MD;  Location: [facility];  Service: Cardiovascular;  Laterality: N/A;   Social History   Occupational History  . Not on file  Tobacco Use  . Smoking status: Current Every Day Smoker    Packs/day: 0.00    Years: 42.00    Pack years: 0.00    Types: Cigarettes  . Smokeless tobacco: Never Used  . Tobacco comment: 1 cigs daily - 12-16-19  Substance and Sexual Activity  . Alcohol use: Yes    Comment: a 12 pack of beer a week.  . Drug use: Never  . Sexual activity: Not on file

## 2019-12-28 ENCOUNTER — Other Ambulatory Visit: Payer: Self-pay

## 2019-12-28 ENCOUNTER — Ambulatory Visit: Payer: HRSA Program | Attending: Internal Medicine

## 2019-12-28 DIAGNOSIS — Z20822 Contact with and (suspected) exposure to covid-19: Secondary | ICD-10-CM

## 2019-12-28 DIAGNOSIS — Z20828 Contact with and (suspected) exposure to other viral communicable diseases: Secondary | ICD-10-CM | POA: Insufficient documentation

## 2019-12-29 LAB — NOVEL CORONAVIRUS, NAA: SARS-CoV-2, NAA: NOT DETECTED

## 2020-01-05 ENCOUNTER — Ambulatory Visit: Payer: Self-pay | Admitting: Physician Assistant

## 2020-01-05 ENCOUNTER — Encounter: Payer: Self-pay | Admitting: Physician Assistant

## 2020-01-05 VITALS — BP 175/91 | HR 89

## 2020-01-05 DIAGNOSIS — I10 Essential (primary) hypertension: Secondary | ICD-10-CM

## 2020-01-05 MED ORDER — METOPROLOL TARTRATE 50 MG PO TABS: 50.0000 mg | ORAL_TABLET | Freq: Two times a day (BID) | ORAL | 1 refills | Status: DC

## 2020-01-05 NOTE — Progress Notes (Signed)
BP (!) 175/91   Pulse 89    Subjective:    Patient ID: Adam Marquez, male    DOB: November 24, 1956, 64 y.o.   MRN: [id number]  HPI: Adam Marquez is a 64 y.o. male presenting on 01/05/2020 for No chief complaint on file.   HPI  This is a telemedicine appointment through [employer] due to coronavirus pandemic.    I connected with  Adam Marquez on 01/05/20 by a video enabled telemedicine application and verified that I am speaking with the correct person using two identifiers.   I discussed the limitations of evaluation and management by telemedicine. The patient expressed understanding and agreed to proceed.  Pt is at home.  Provider is at office.    Pt is 63yoM with appointment today to follow up on HTN and to review labs.   He submitted application for financial assistance.  He is being treated by orthopedics for ankle fracture.   He reports a lot of Trouble breathing at night.  Breaths okay during day  He says he is feeling well today.     Relevant past medical, surgical, family and social history reviewed and updated as indicated. Interim medical history since our last visit reviewed. Allergies and medications reviewed and updated.   Current Outpatient Medications:  .  albuterol (VENTOLIN HFA) 108 (90 Base) MCG/ACT inhaler, Inhale 2 puffs into the lungs every 4 (four) hours as needed for wheezing or shortness of breath., Disp: 18 g, Rfl: 1 .  hydrocortisone cream 1 %, Apply 1 application topically 2 (two) times daily., Disp: , Rfl:  .  losartan (COZAAR) 100 MG tablet, Take 1 tablet (100 mg total) by mouth daily., Disp: 30 tablet, Rfl: 1 .  oxymetazoline (AFRIN) 0.05 % nasal spray, Place 2 sprays into both nostrils as needed for congestion., Disp: , Rfl:  .  Polyvinyl Alcohol-Povidone (CLEAR EYES ALL SEASONS) 5-6 MG/ML SOLN, Apply 1 drop to eye daily as needed (for allergies)., Disp: , Rfl:  .  HYDROcodone-acetaminophen (NORCO) 5-325 MG tablet, Take 1-2  tablets by mouth 3 (three) times daily as needed for moderate pain. (Patient not taking: Reported on 01/05/2020), Disp: 30 tablet, Rfl: 0 .  losartan (COZAAR) 100 MG tablet, Take 1 tablet (100 mg total) by mouth daily. (Patient not taking: Reported on 01/05/2020), Disp: 90 tablet, Rfl: 0 .  nicotine (NICODERM CQ - DOSED IN MG/24 HOURS) 14 mg/24hr patch, Place 1 patch (14 mg total) onto the skin daily. (Patient not taking: Reported on 01/05/2020), Disp: 28 patch, Rfl: 0     Review of Systems  Per HPI unless specifically indicated above     Objective:    BP (!) 175/91   Pulse 89   Wt Readings from Last 3 Encounters:  11/19/19 298 lb (135.2 kg)  10/30/19 292 lb 1.8 oz (132.5 kg)    Physical Exam Constitutional:      General: He is not in acute distress.    Appearance: Normal appearance. He is obese. He is not ill-appearing.  HENT:     Head: Normocephalic and atraumatic.  Pulmonary:     Effort: Pulmonary effort is normal. No respiratory distress.  Neurological:     Mental Status: He is alert and oriented to person, place, and time.  Psychiatric:        Attention and Perception: Attention normal.        Speech: Speech normal.        Behavior: Behavior is cooperative.  Assessment & Plan:    Encounter Diagnoses  Name Primary?  . Essential hypertension Yes  . Morbid obesity (HCC)      -PSA due- pt wants to wait until next labs due -pt to Continue losartan -will Add metoprolol-  -will refer for Sleep study to evaluate for OSA (order alreay in Epic with ready to schedule note- ordered by Dr Wyline Mood) -Pt to call and check on fis application for financial assistance -pt to follow up 1 month to recheck bp.  He is to contact office sooner prn

## 2020-01-12 ENCOUNTER — Ambulatory Visit: Payer: Self-pay | Admitting: Orthopaedic Surgery

## 2020-02-02 ENCOUNTER — Ambulatory Visit: Payer: Self-pay | Admitting: Physician Assistant

## 2020-02-23 ENCOUNTER — Telehealth: Payer: Self-pay | Admitting: Cardiology

## 2020-03-01 ENCOUNTER — Other Ambulatory Visit: Payer: Self-pay

## 2020-03-01 ENCOUNTER — Ambulatory Visit (INDEPENDENT_AMBULATORY_CARE_PROVIDER_SITE_OTHER): Payer: Self-pay | Admitting: Student

## 2020-03-01 ENCOUNTER — Encounter: Payer: Self-pay | Admitting: Student

## 2020-03-01 VITALS — BP 138/72 | HR 91 | Temp 98.2°F | Ht 72.0 in | Wt 327.0 lb

## 2020-03-01 DIAGNOSIS — I4819 Other persistent atrial fibrillation: Secondary | ICD-10-CM

## 2020-03-01 DIAGNOSIS — I10 Essential (primary) hypertension: Secondary | ICD-10-CM

## 2020-03-01 DIAGNOSIS — G4730 Sleep apnea, unspecified: Secondary | ICD-10-CM

## 2020-03-01 DIAGNOSIS — Z79899 Other long term (current) drug therapy: Secondary | ICD-10-CM

## 2020-03-01 DIAGNOSIS — I5032 Chronic diastolic (congestive) heart failure: Secondary | ICD-10-CM

## 2020-03-01 MED ORDER — FUROSEMIDE 40 MG PO TABS: 40.0000 mg | ORAL_TABLET | Freq: Two times a day (BID) | ORAL | 5 refills | Status: DC

## 2020-03-01 MED ORDER — POTASSIUM CHLORIDE ER 10 MEQ PO TBCR: 10.0000 meq | EXTENDED_RELEASE_TABLET | Freq: Two times a day (BID) | ORAL | 5 refills | Status: DC

## 2020-03-01 NOTE — Progress Notes (Signed)
Cardiology Office Note    Date:  03/01/2020   ID:  Adam, Marquez 17-Oct-1956, MRN [id number]  PCP:  Jacquelin Hawking, PA-C  Cardiologist: Dina Rich, MD    Chief Complaint  Patient presents with  . Follow-up    Lower Extremity Edema    History of Present Illness:    Adam Marquez is a 64 y.o. male with past medical history of persistent atrial fibrillation (s/p DCCV in 10/2019), chronic diastolic CHF, HTN and COPD who presents to the office today for evaluation of lower extremity edema.   He was last examined by Dr. Wyline Mood in 11/2019 following his recent hospitalization and was maintaining NSR at that time and denied any recent palpitations. Weight was 298 lbs on the office scales but had been stable at home. Given his reports of dizziness with standing being concerning for orthostasis, Lasix was reduced from 40mg  daily to 20mg  daily and he was informed to stop Amiodarone at the end of the month as a short-course of this had been recommended. He was continued on Cardizem CD 120mg  daily and Eliquis 5mg  BID.   In the interim, he did follow-up with his PCP and was started on Losartan 100mg  daily and Lopressor 50mg  BID for elevated BP. Was not taking Cardizem by review of his medications.   In talking with the patient today, he reports worsening lower extremity edema for the past few months. His legs feel heavy due to this and he has been less active. Also reports dyspnea on exertion and orthopnea. No associated chest pain or palpitations.   He did titrate his Lasix from 40mg  daily to 80mg  daily for 1-2 weeks with improvement in his symptoms but has since reduced the dose. He is no longer on Cardizem or Eliquis given the cost of the medications.     Past Medical History:  Diagnosis Date  . Atrial fibrillation (HCC)    a. s/p DCCV in 10/2019  . COPD (chronic obstructive pulmonary disease) (HCC)   . Diabetes mellitus without complication (HCC)   . Eczema   .  Hyperlipidemia   . Hypertension     Past Surgical History:  Procedure Laterality Date  . ANAL FISSURE REPAIR  1980  . CARDIOVERSION N/A 10/29/2019   Procedure: CARDIOVERSION WITH PROPOFOL;  Surgeon: , MD;  Location: [facility];  Service: Cardiovascular;  Laterality: N/A;  . MOUTH SURGERY  1990  . TEE WITHOUT CARDIOVERSION N/A 10/29/2019   Procedure: TRANSESOPHAGEAL ECHOCARDIOGRAM (TEE) WITH PROPOFOL;  Surgeon: , MD;  Location: [facility];  Service: Cardiovascular;  Laterality: N/A;    Current Medications: Outpatient Medications Prior to Visit  Medication Sig Dispense Refill  . albuterol (VENTOLIN HFA) 108 (90 Base) MCG/ACT inhaler Inhale 2 puffs into the lungs every 4 (four) hours as needed for wheezing or shortness of breath. 18 g 1  . diltiazem (CARDIZEM) 120 MG tablet Take 120 mg by mouth 4 (four) times daily.    . hydrocortisone cream 1 % Apply 1 application topically 2 (two) times daily.    losartan (COZAAR) 100 MG tablet Take 1 tablet (100 mg total) by mouth daily. 90 tablet 0  . metoprolol tartrate (LOPRESSOR) 50 MG tablet Take 1 tablet (50 mg total) by mouth 2 (two) times daily. 60 tablet 1  . oxymetazoline (AFRIN) 0.05 % nasal spray Place 2 sprays into both nostrils as needed for congestion.    . Polyvinyl Alcohol-Povidone (CLEAR EYES ALL SEASONS) 5-6 MG/ML SOLN Apply  1 drop to eye daily as needed (for allergies).    . furosemide (LASIX) 40 MG tablet Take 40 mg by mouth.    [name] Kitchen HYDROcodone-acetaminophen (NORCO) 5-325 MG tablet Take 1-2 tablets by mouth 3 (three) times daily as needed for moderate pain. 30 tablet 0  . losartan (COZAAR) 100 MG tablet Take 1 tablet (100 mg total) by mouth daily. 30 tablet 1  . nicotine (NICODERM CQ - DOSED IN MG/24 HOURS) 14 mg/24hr patch Place 1 patch (14 mg total) onto the skin daily. 28 patch 0  . potassium chloride (KLOR-CON) 10 MEQ tablet Take 10 mEq by mouth daily.     No facility-administered medications  prior to visit.     Allergies:   Keflex [cephalexin]   Social History   Socioeconomic History  . Marital status: Single    Spouse name: Not on file  . Number of children: Not on file  . Years of education: Not on file  . Highest education level: Not on file  Occupational History  . Not on file  Tobacco Use  . Smoking status: Current Some Day Smoker    Packs/day: 0.00    Years: 42.00    Pack years: 0.00    Types: Cigarettes  . Smokeless tobacco: Never Used  . Tobacco comment: 1 cigs daily - 12-16-19  Substance and Sexual Activity  . Alcohol use: Yes    Comment: a 12 pack of beer a week.  . Drug use: Never  . Sexual activity: Not on file  Other Topics Concern  . Not on file  Social History Narrative  . Not on file   Social Determinants of Health   Financial Resource Strain:   . Difficulty of Paying Living Expenses: Not on file  Food Insecurity:   . Worried About Programme researcher, broadcasting/film/video in the Last Year: Not on file  . Ran Out of Food in the Last Year: Not on file  Transportation Needs:   . Lack of Transportation (Medical): Not on file  . Lack of Transportation (Non-Medical): Not on file  Physical Activity:   . Days of Exercise per Week: Not on file  . Minutes of Exercise per Session: Not on file  Stress:   . Feeling of Stress : Not on file  Social Connections:   . Frequency of Communication with Friends and Family: Not on file  . Frequency of Social Gatherings with Friends and Family: Not on file  . Attends Religious Services: Not on file  . Active Member of Clubs or Organizations: Not on file  . Attends Banker Meetings: Not on file  . Marital Status: Not on file     Family History:  The patient's family history includes Cancer in his daughter; Diabetes in his brother; Heart disease in his father; Hypertension in his father; Lung cancer in his mother.   Review of Systems:   Please see the history of present illness.     General:  No chills, fever,  night sweats or weight changes.  Cardiovascular:  No chest pain,  palpitations, paroxysmal nocturnal dyspnea. Positive for dyspnea on exertion, orthopnea and edema.  Dermatological: No rash, lesions/masses Respiratory: No cough, dyspnea Urologic: No hematuria, dysuria Abdominal:   No nausea, vomiting, diarrhea, bright red blood per rectum, melena, or hematemesis Neurologic:  No visual changes, wkns, changes in mental status. All other systems reviewed and are otherwise negative except as noted above.   Physical Exam:    VS:  BP 138/72  Pulse 91   Temp 98.2 F (36.8 C)   Ht 6' (1.829 m)   Wt (!) 327 lb (148.3 kg)   SpO2 97%   BMI 44.35 kg/m    General: Well developed, well nourished,male appearing in no acute distress. Head: Normocephalic, atraumatic, sclera non-icteric.  Neck: No carotid bruits. JVD difficult to assess secondary to body habitus.  Lungs: Respirations regular and unlabored, without wheezes or rales.  Heart: Regular rate and rhythm. No S3 or S4.  No murmur, no rubs, or gallops appreciated. Abdomen: Soft, non-tender, non-distended. No obvious abdominal masses. Msk:  Strength and tone appear normal for age. No obvious joint deformities or effusions. Extremities: No clubbing or cyanosis. 1+ pitting edema bilaterally.  Distal pedal pulses are 2+ bilaterally. Neuro: Alert and oriented X 3. Moves all extremities spontaneously. No focal deficits noted. Psych:  Responds to questions appropriately with a normal affect. Skin: No rashes or lesions noted  Wt Readings from Last 3 Encounters:  03/01/20 (!) 327 lb (148.3 kg)  11/19/19 298 lb (135.2 kg)  10/30/19 292 lb 1.8 oz (132.5 kg)     Studies/Labs Reviewed:   EKG:  EKG is not ordered today.   Recent Labs: 10/22/2019: ALT 20; B Natriuretic Peptide 247.0 10/23/2019: TSH 2.139 10/26/2019: Hemoglobin 15.4; Platelets 202 12/16/2019: BUN 8; Creatinine, Ser 0.91; Magnesium 1.9; Potassium 4.6; Sodium 144   Lipid Panel     Component Value Date/Time   CHOL 103 10/24/2019 0402   TRIG 59 10/24/2019 0402   HDL 29 (L) 10/24/2019 0402   CHOLHDL 3.6 10/24/2019 0402   VLDL 12 10/24/2019 0402   LDLCALC 62 10/24/2019 0402    Additional studies/ records that were reviewed today include:   Echocardiogram: 10/2019 IMPRESSIONS    1. Left ventricular ejection fraction, by visual estimation, is 55 to  60%. The left ventricle has normal function. There is mildly increased  left ventricular hypertrophy.  2. Left ventricular diastolic Doppler parameters are indeterminate  pattern of LV diastolic filling.  3. Global right ventricle has low normal systolic function.The right  ventricular size is normal. No increase in right ventricular wall  thickness.  4. Left atrial size was moderately dilated.  5. Right atrial size was not well visualized.  6. The mitral valve was not well visualized. No evidence of mitral valve  regurgitation. No evidence of mitral stenosis.  7. The tricuspid valve is not well visualized. Tricuspid valve  regurgitation was not visualized by color flow Doppler.  8. The aortic valve has an indeterminant number of cusps Aortic valve  regurgitation was not visualized by color flow Doppler. Structurally  normal aortic valve, with no evidence of sclerosis or stenosis.  9. The pulmonic valve was not well visualized. Pulmonic valve  regurgitation is not visualized by color flow Doppler.  10. The inferior vena cava is dilated in size with >50% respiratory  variability, suggesting right atrial pressure of 8 mmHg.   Assessment:    1. Chronic diastolic heart failure (HCC)   2. Persistent atrial fibrillation (HCC)   3. Essential hypertension   4. Sleep-disordered breathing   5. Medication management      Plan:   In order of problems listed above:  1. Chronic Diastolic CHF - weight has increased by over 30 lbs within the past 4 months and he reports this has been gradual. He has  developed worsening dyspnea on exertion, orthopnea, PND and edema.  - he is currently on Lasix 40mg  daily. Will titrate to 40mg  BID  and increase K-dur to 10 mEq BID. Repeat BMET in 1 week.  - he has been consuming over a gallon of water daily and I recommended he limit his intake to less than 2L daily. Importance of sodium restriction reviewed as well.   2. Persistent Atrial Fibrillation - he is s/p DCCV in 10/2019 and is maintaining NSR today. Listed as being on Cardizem 120mg  QID but says he stopped this secondary to cost. Remains on Lopresor 50mg  BID.  - he was previously on Eliquis 5mg  BID but discontinued over 2 months ago due to cost. Unable to afford Coumadin checks as well so if needing to restart this, would need to enroll in patient assistance. Given the episode in 10/2019 was his first occurrence, will review with Dr. Harl Bowie in regards to restarting anticoagulation.   3. HTN - BP initially checked upon walking in the office and elevated at 179/93, rechecked and at 138/72. He reports this has overall been well-controlled when checked at home. Continue current regimen for now.   4. Sleep Disordered Breathing - he reports snoring and daytime somnolence. Given his body habitus along with history of atrial fibrillation, he would benefit from a sleep study as OSA could be contributing to his CHF. He was initially referred to Dr. Luan Pulling who has now retired. Will refer to [facility].    Medication Adjustments/Labs and Tests Ordered: Current medicines are reviewed at length with the patient today.  Concerns regarding medicines are outlined above.  Medication changes, Labs and Tests ordered today are listed in the Patient Instructions below. Patient Instructions  Medication Instructions:  Your physician has recommended you make the following change in your medication:  Increase Lasix to 40 mg Two Times Daily  Increase Potassium to 10 meq Two Times Daily   *If you need a refill on  your cardiac medications before your next appointment, please call your pharmacy*   Lab Work: Your physician recommends that you return for lab work in: 1 Week ( 03/08/20)   If you have labs (blood work) drawn today and your tests are completely normal, you will receive your results only by: [name] Kitchen MyChart Message (if you have MyChart) OR . A paper copy in the mail If you have any lab test that is abnormal or we need to change your treatment, we will call you to review the results.   Testing/Procedures: NONE    Follow-Up: At [facility], you and your health needs are our priority.  As part of our continuing mission to provide you with exceptional heart care, we have created designated Provider Care Teams.  These Care Teams include your primary Cardiologist (physician) and Advanced Practice Providers (APPs -  Physician Assistants and Nurse Practitioners) who all work together to provide you with the care you need, when you need it.  We recommend signing up for the patient portal called "MyChart".  Sign up information is provided on this After Visit Summary.  MyChart is used to connect with patients for Virtual Visits (Telemedicine).  Patients are able to view lab/test results, encounter notes, upcoming appointments, etc.  Non-urgent messages can be sent to your provider as well.   To learn more about what you can do with MyChart, go to [email or web address].    Your next appointment:   6-8 week(s)  The format for your next appointment:   In Person  Provider:   Carlyle Dolly, MD   Other Instructions Thank you for choosing [name]!    Signed, Fransisco Hertz  Iran Ouch, PA-C  03/01/2020 6:59 PM    [facility] [address] [place], [place] [address] Phone: [phone] Fax: [phone]

## 2020-03-01 NOTE — Patient Instructions (Signed)
Medication Instructions:  Your physician has recommended you make the following change in your medication:  Increase Lasix to 40 mg Two Times Daily  Increase Potassium to 10 meq Two Times Daily   *If you need a refill on your cardiac medications before your next appointment, please call your pharmacy*   Lab Work: Your physician recommends that you return for lab work in: 1 Week ( 03/08/20)   If you have labs (blood work) drawn today and your tests are completely normal, you will receive your results only by: [name] Kitchen MyChart Message (if you have MyChart) OR . A paper copy in the mail If you have any lab test that is abnormal or we need to change your treatment, we will call you to review the results.   Testing/Procedures: NONE    Follow-Up: At [facility], you and your health needs are our priority.  As part of our continuing mission to provide you with exceptional heart care, we have created designated Provider Care Teams.  These Care Teams include your primary Cardiologist (physician) and Advanced Practice Providers (APPs -  Physician Assistants and Nurse Practitioners) who all work together to provide you with the care you need, when you need it.  We recommend signing up for the patient portal called "MyChart".  Sign up information is provided on this After Visit Summary.  MyChart is used to connect with patients for Virtual Visits (Telemedicine).  Patients are able to view lab/test results, encounter notes, upcoming appointments, etc.  Non-urgent messages can be sent to your provider as well.   To learn more about what you can do with MyChart, go to [email or web address].    Your next appointment:   6-8 week(s)  The format for your next appointment:   In Person  Provider:   Dina Rich, MD   Other Instructions Thank you for choosing [facility]!

## 2020-03-02 NOTE — Progress Notes (Signed)
He should be on an anticoag if possible. Have the nurses looked into eliquis assistance. If does not qualify then yes would have him apply for patietn assistance to see if he could get coumadin.   Dominga Ferry MD

## 2020-03-21 ENCOUNTER — Other Ambulatory Visit: Payer: Self-pay | Admitting: Physician Assistant

## 2020-03-21 MED ORDER — LOSARTAN POTASSIUM 100 MG PO TABS: 100.0000 mg | ORAL_TABLET | Freq: Every day | ORAL | 0 refills | Status: DC

## 2020-03-21 MED ORDER — METOPROLOL TARTRATE 50 MG PO TABS: 50.0000 mg | ORAL_TABLET | Freq: Two times a day (BID) | ORAL | 0 refills | Status: DC

## 2020-03-23 ENCOUNTER — Encounter: Payer: Self-pay | Admitting: Physician Assistant

## 2020-03-23 ENCOUNTER — Ambulatory Visit: Payer: Self-pay | Admitting: Physician Assistant

## 2020-03-23 VITALS — BP 124/57

## 2020-03-23 DIAGNOSIS — Z125 Encounter for screening for malignant neoplasm of prostate: Secondary | ICD-10-CM

## 2020-03-23 DIAGNOSIS — I10 Essential (primary) hypertension: Secondary | ICD-10-CM

## 2020-03-23 DIAGNOSIS — I5032 Chronic diastolic (congestive) heart failure: Secondary | ICD-10-CM

## 2020-03-23 DIAGNOSIS — R7303 Prediabetes: Secondary | ICD-10-CM

## 2020-03-23 MED ORDER — LOSARTAN POTASSIUM 100 MG PO TABS: 100.0000 mg | ORAL_TABLET | Freq: Every day | ORAL | 0 refills | Status: DC

## 2020-03-23 MED ORDER — METOPROLOL TARTRATE 50 MG PO TABS: 50.0000 mg | ORAL_TABLET | Freq: Two times a day (BID) | ORAL | 0 refills | Status: DC

## 2020-03-23 MED ORDER — ALBUTEROL SULFATE HFA 108 (90 BASE) MCG/ACT IN AERS: 2.0000 | INHALATION_SPRAY | RESPIRATORY_TRACT | 1 refills | Status: DC | PRN

## 2020-03-23 NOTE — Progress Notes (Signed)
BP (!) 124/57    Subjective:    Patient ID: Adam Marquez, male    DOB: 01-15-1956, 64 y.o.   MRN: [id number]  HPI: Adam Marquez is a 64 y.o. male presenting on 03/23/2020 for No chief complaint on file.   HPI   This is a telemedicine appointment due to coronavirus pandemic.  It started through [employer] but then had to change to telephone due to pt having poor wifi signal.    I connected with  Adam Marquez on 03/23/20 by a video enabled telemedicine application and verified that I am speaking with the correct person using two identifiers.   I discussed the limitations of evaluation and management by telemedicine. The patient expressed understanding and agreed to proceed.  Pt is at home.  Provider is at office.    Pt is 26yoM ith morbid obesity, HF, AF, HTN.  He sees cardiology regularly and they are recomending anticoagulation but pt stopped the eliquis he had been taking due to cost.   Pt says he is Sleeping well lately.  Pt monitors his BP at home and bp today is excellent!    Relevant past medical, surgical, family and social history reviewed and updated as indicated. Interim medical history since our last visit reviewed. Allergies and medications reviewed and updated.   Current Outpatient Medications:  .  losartan (COZAAR) 100 MG tablet, Take 1 tablet (100 mg total) by mouth daily., Disp: 30 tablet, Rfl: 0 .  oxymetazoline (AFRIN) 0.05 % nasal spray, Place 2 sprays into both nostrils as needed for congestion., Disp: , Rfl:  .  Polyvinyl Alcohol-Povidone (CLEAR EYES ALL SEASONS) 5-6 MG/ML SOLN, Apply 1 drop to eye daily as needed (for allergies)., Disp: , Rfl:  .  albuterol (VENTOLIN HFA) 108 (90 Base) MCG/ACT inhaler, Inhale 2 puffs into the lungs every 4 (four) hours as needed for wheezing or shortness of breath. (Patient not taking: Reported on 03/23/2020), Disp: 18 g, Rfl: 1 .  diltiazem (CARDIZEM) 120 MG tablet, Take 120 mg by mouth 4 (four) times  daily., Disp: , Rfl:  .  furosemide (LASIX) 40 MG tablet, Take 1 tablet (40 mg total) by mouth 2 (two) times daily. (Patient not taking: Reported on 03/23/2020), Disp: 60 tablet, Rfl: 5 .  hydrocortisone cream 1 %, Apply 1 application topically 2 (two) times daily., Disp: , Rfl:  .  metoprolol tartrate (LOPRESSOR) 50 MG tablet, Take 1 tablet (50 mg total) by mouth 2 (two) times daily. (Patient not taking: Reported on 03/23/2020), Disp: 60 tablet, Rfl: 0 .  potassium chloride (KLOR-CON) 10 MEQ tablet, Take 1 tablet (10 mEq total) by mouth 2 (two) times daily. (Patient not taking: Reported on 03/23/2020), Disp: 60 tablet, Rfl: 5     Review of Systems  Per HPI unless specifically indicated above     Objective:    BP (!) 124/57   Wt Readings from Last 3 Encounters:  03/01/20 (!) 327 lb (148.3 kg)  11/19/19 298 lb (135.2 kg)  10/30/19 292 lb 1.8 oz (132.5 kg)    Physical Exam Constitutional:      General: He is not in acute distress.    Appearance: He is obese. He is not ill-appearing.  HENT:     Head: Normocephalic and atraumatic.  Pulmonary:     Effort: No respiratory distress.  Neurological:     Mental Status: He is alert and oriented to person, place, and time.  Psychiatric:  Attention and Perception: Attention normal.        Speech: Speech normal.           Assessment & Plan:    Encounter Diagnoses  Name Primary?  . Screening for malignant neoplasm of prostate Yes  . Essential hypertension   . Morbid obesity (HCC)   . Chronic diastolic heart failure (HCC)   . Prediabetes      -Add psa to labs (he is scheduled to get BMP for cardiology) -no changes to medications today.  Will contact cardiology to see if assistance needed getting pt's meds -Pt is put on schedule for covid vaccination -Pt to follow up here in 3 months.  He is to contact office sooner prn

## 2020-03-28 ENCOUNTER — Telehealth: Payer: Self-pay | Admitting: *Deleted

## 2020-03-28 MED ORDER — RIVAROXABAN 20 MG PO TABS: 20.0000 mg | ORAL_TABLET | Freq: Every day | ORAL | 11 refills | Status: DC

## 2020-03-28 NOTE — Telephone Encounter (Signed)
Called pt to notify of change. No answer unable to leave voice mail.

## 2020-03-28 NOTE — Telephone Encounter (Signed)
-----   Message from Adam Marquez, [place] sent at 03/27/2020  7:11 PM EDT ----- Regarding: [facility] Hi Kisha,   I had been communicating with this patient's PCP and he was unable to afford Eliquis but Xarelto is available through [facility]. Can you send in Xarelto 20mg  daily to [facility] and stop Eliquis? The patient will need to be updated as well (I am unsure if he is still taking Eliquis as we provided samples at his last visit).  Thanks,   ----- Message ----- From: [place], PA-C Sent: 03/26/2020   5:21 AM EDT To: 03/28/2020, PA-C  If you could send it and you can let him know also (as I am out of the office until April 5).   [facility] is in EMR (in [place]).   There is list of what is available at [place] if you ever want to know.  They unfortunately do not carry cardizem.    Thank you! ----- Message ----- From: [email or web address] PA-C Sent: 03/24/2020   7:46 PM EDT To: 03/26/2020, PA-C, Jacquelin Hawking [place], LPN  Karl Pock,   Thanks for reaching out! That would be great. I would be fine with him switching from Eliquis to [place] 20mg  daily. Is that something you can send in or need [place] to?   Best,   ----- Message ----- From: [place], PA-C Sent: 03/23/2020   9:12 AM EDT To: Jacquelin Hawking, PA-C  Please call me or my nurse about [place] medications if needed.  We might be able to help with assistance programs.   FYI- xarelto is available through Adam Marquez which he is already approved if that would be acceptable for his nonvalvular Afib.  Clinic # is [phone].  Thank you and have a great day. [employer]

## 2020-03-29 ENCOUNTER — Other Ambulatory Visit: Payer: Self-pay

## 2020-03-29 MED ORDER — FUROSEMIDE 40 MG PO TABS: 40.0000 mg | ORAL_TABLET | Freq: Two times a day (BID) | ORAL | 5 refills | Status: DC

## 2020-03-29 NOTE — Telephone Encounter (Signed)
Pt request furosemide 40mg  be call into [facility]. Pt did not provide number for [facility].  Please call Pt [phone]   Thanks renee

## 2020-03-29 NOTE — Telephone Encounter (Signed)
Done

## 2020-03-30 NOTE — Telephone Encounter (Signed)
Returning call.

## 2020-03-31 NOTE — Telephone Encounter (Signed)
Notified pt of change from Eliquis to Xarelto. Pt requesting refill on lasix.  Refill sent on 3/30.

## 2020-04-08 ENCOUNTER — Institutional Professional Consult (permissible substitution): Payer: Self-pay | Admitting: Internal Medicine

## 2020-04-19 ENCOUNTER — Ambulatory Visit: Payer: Self-pay | Admitting: Cardiology

## 2020-04-19 NOTE — Progress Notes (Deleted)
Clinical Summary Adam Marquez is a 64 y.o.male seen today for follow up of the following medical problems.   1. Afib - new diagnosis during 10/2019 admission - difficult to control during the admission, ultimately needed TEE/DCCV on 10/29/19 - was started on amio with plans for short course (at d/c was to have amio 200mg  bid x 2 weeks, then 200mg  daily x 2 weeks, then stop).  - startd on eliquis for stroke prevention  - no recent palpitations - home heart rates 70s-80s  - cost issues with eliquis, has med assist for xarelto    2.Chronic diastolic heart failure - diuresed during recent admission - home scale 285, our scale 298 lbs. Hospital was 291 lbs.  - some SOB with walking to mailbox and back which is chronic.   - last visit noted 30 lbs weight gain over the last 4 months. Lasix was increased to 40mg  bid   3. Suspected OSA - has not been test  - referred to [facility] during 03/01/20 visit  4. Dizzy spells - recent orthostatic symptoms, mild dizziness with standing.    5. Suspected COPD - 40 year smoking history, has not had formal diagnosis by PFTs     Past Medical History:  Diagnosis Date  . Atrial fibrillation ([place])    a. s/p DCCV in 10/2019  . COPD (chronic obstructive pulmonary disease) ([place])   . Diabetes mellitus without complication ([place])   . Eczema   . Hyperlipidemia   . Hypertension      Allergies  Allergen Reactions  . Keflex [Cephalexin] Hives and Nausea And Vomiting     Current Outpatient Medications  Medication Sig Dispense Refill  . albuterol (VENTOLIN HFA) 108 (90 Base) MCG/ACT inhaler Inhale 2 puffs into the lungs every 4 (four) hours as needed for wheezing or shortness of breath. 18 g 1  . diltiazem (CARDIZEM) 120 MG tablet Take 120 mg by mouth 4 (four) times daily.    . furosemide (LASIX) 40 MG tablet Take 1 tablet (40 mg total) by mouth 2 (two) times daily. 60 tablet 5  . hydrocortisone cream 1 % Apply 1  application topically 2 (two) times daily.    [name] Kitchen losartan (COZAAR) 100 MG tablet Take 1 tablet (100 mg total) by mouth daily. 90 tablet 0  . metoprolol tartrate (LOPRESSOR) 50 MG tablet Take 1 tablet (50 mg total) by mouth 2 (two) times daily. 180 tablet 0  . oxymetazoline (AFRIN) 0.05 % nasal spray Place 2 sprays into both nostrils as needed for congestion.    . Polyvinyl Alcohol-Povidone (CLEAR EYES ALL SEASONS) 5-6 MG/ML SOLN Apply 1 drop to eye daily as needed (for allergies).    . potassium chloride (KLOR-CON) 10 MEQ tablet Take 1 tablet (10 mEq total) by mouth 2 (two) times daily. (Patient not taking: Reported on 03/23/2020) 60 tablet 5  . rivaroxaban (XARELTO) 20 MG TABS tablet Take 1 tablet (20 mg total) by mouth daily with supper. 30 tablet 11   No current facility-administered medications for this visit.     Past Surgical History:  Procedure Laterality Date  . ANAL FISSURE REPAIR  1980  . CARDIOVERSION N/A 10/29/2019   Procedure: CARDIOVERSION WITH PROPOFOL;  Surgeon: Herminio Commons, MD;  Location: [facility];  Service: Cardiovascular;  Laterality: N/A;  . [place]  . TEE WITHOUT CARDIOVERSION N/A 10/29/2019   Procedure: TRANSESOPHAGEAL ECHOCARDIOGRAM (TEE) WITH PROPOFOL;  Surgeon: Herminio Commons, MD;  Location: [facility];  Service:  Cardiovascular;  Laterality: N/A;     Allergies  Allergen Reactions  . Keflex [Cephalexin] Hives and Nausea And Vomiting      Family History  Problem Relation Age of Onset  . Lung cancer Mother   . Heart disease Father   . Hypertension Father   . Diabetes Brother   . Cancer Daughter      Social History Mr. Monnier reports that he has been smoking cigarettes. He has been smoking about 0.00 packs per day for the past 42.00 years. He has never used smokeless tobacco. Mr. Encinas reports current alcohol use.   Review of Systems CONSTITUTIONAL: No weight loss, fever, chills, weakness or fatigue.  HEENT: Eyes: No visual  loss, blurred vision, double vision or yellow sclerae.No hearing loss, sneezing, congestion, runny nose or sore throat.  SKIN: No rash or itching.  CARDIOVASCULAR:  RESPIRATORY: No shortness of breath, cough or sputum.  GASTROINTESTINAL: No anorexia, nausea, vomiting or diarrhea. No abdominal pain or blood.  GENITOURINARY: No burning on urination, no polyuria NEUROLOGICAL: No headache, dizziness, syncope, paralysis, ataxia, numbness or tingling in the extremities. No change in bowel or bladder control.  MUSCULOSKELETAL: No muscle, back pain, joint pain or stiffness.  LYMPHATICS: No enlarged nodes. No history of splenectomy.  PSYCHIATRIC: No history of depression or anxiety.  ENDOCRINOLOGIC: No reports of sweating, cold or heat intolerance. No polyuria or polydipsia.  [name] Kitchen   Physical Examination There were no vitals filed for this visit. There were no vitals filed for this visit.  Gen: resting comfortably, no acute distress HEENT: no scleral icterus, pupils equal round and reactive, no palptable cervical adenopathy,  CV Resp: Clear to auscultation bilaterally GI: abdomen is soft, non-tender, non-distended, normal bowel sounds, no hepatosplenomegaly MSK: extremities are warm, no edema.  Skin: warm, no rash Neuro:  no focal deficits Psych: appropriate affect   Diagnostic Studies 10/2019 echo IMPRESSIONS   1. Left ventricular ejection fraction, by visual estimation, is 55 to 60%. The left ventricle has normal function. There is mildly increased left ventricular hypertrophy. 2. Left ventricular diastolic Doppler parameters are indeterminate pattern of LV diastolic filling. 3. Global right ventricle has low normal systolic function.The right ventricular size is normal. No increase in right ventricular wall thickness. 4. Left atrial size was moderately dilated. 5. Right atrial size was not well visualized. 6. The mitral valve was not well visualized. No evidence of mitral valve  regurgitation. No evidence of mitral stenosis. 7. The tricuspid valve is not well visualized. Tricuspid valve regurgitation was not visualized by color flow Doppler. 8. The aortic valve has an indeterminant number of cusps Aortic valve regurgitation was not visualized by color flow Doppler. Structurally normal aortic valve, with no evidence of sclerosis or stenosis. 9. The pulmonic valve was not well visualized. Pulmonic valve regurgitation is not visualized by color flow Doppler. 10. The inferior vena cava is dilated in size with >50% respiratory variability, suggesting right atrial pressure of 8 mmHg.   10/2019 TEE IMPRESSIONS   1. Left ventricular ejection fraction, by visual estimation, is 60 to 65%. The left ventricle has normal function. There is mildly increased left ventricular hypertrophy. 2. Left ventricular diastolic function could not be evaluated secondary to rapid atrial fibrillation. 3. Global right ventricle has low normal systolic function.The right ventricular size is normal. Mildly increased right ventricular wall thickness. 4. Left atrial size was moderately dilated. 5. No left atrial appendage thrombus. Intracavitary velocities are normal. 6. Right atrial size was mildly dilated. 7. The mitral  valve is normal in structure. No evidence of mitral valve regurgitation. 8. The tricuspid valve is normal in structure. Tricuspid valve regurgitation is mild. 9. The aortic valve is tricuspid. Aortic valve regurgitation is not visualized. No evidence of aortic valve sclerosis or stenosis. 10. The pulmonic valve was normal in structure. Pulmonic valve regurgitation is not visualized.    Assessment and Plan   1. PAF - EKG today shows SR, no recent symptoms - continue current meds, he will complete his amio at the end of this month as only a short course was planned when initiated  2. Suspected OSA - refer to Dr Juanetta Gosling for evaluation  3. Orthostatic  dizziness - lower lasix to 20mg  daily, may take additional 20mg  as needed  4. Chronic diastolic HF - appears euvolemic by exam, based on orthostatic symptos suspect perhaps over diuresed - lower lasix as reported above  5. SOB - chronic and stable. I think his cardiac issues are doing well, if progression of symptoms would pursue PFTs given his long smoking history. He only has a rescue inhaler at this time.      , M.D., F.A.C.C.

## 2020-04-22 ENCOUNTER — Ambulatory Visit: Payer: Self-pay | Admitting: Cardiology

## 2020-04-25 ENCOUNTER — Institutional Professional Consult (permissible substitution): Payer: Self-pay | Admitting: Pulmonary Disease

## 2020-05-09 ENCOUNTER — Other Ambulatory Visit: Payer: Self-pay | Admitting: Physician Assistant

## 2020-06-06 ENCOUNTER — Other Ambulatory Visit: Payer: Self-pay | Admitting: Physician Assistant

## 2020-06-27 ENCOUNTER — Ambulatory Visit: Payer: Self-pay | Admitting: Physician Assistant

## 2020-08-09 ENCOUNTER — Other Ambulatory Visit: Payer: Self-pay | Admitting: Physician Assistant

## 2020-08-26 ENCOUNTER — Other Ambulatory Visit: Payer: Self-pay | Admitting: Physician Assistant

## 2020-08-30 ENCOUNTER — Other Ambulatory Visit: Payer: Self-pay | Admitting: Physician Assistant

## 2020-08-31 ENCOUNTER — Other Ambulatory Visit: Payer: Self-pay | Admitting: Physician Assistant

## 2020-08-31 ENCOUNTER — Other Ambulatory Visit: Payer: Self-pay | Admitting: Cardiology

## 2020-09-06 ENCOUNTER — Other Ambulatory Visit: Payer: Self-pay | Admitting: Physician Assistant

## 2020-09-08 ENCOUNTER — Other Ambulatory Visit: Payer: Self-pay | Admitting: Physician Assistant

## 2020-09-26 ENCOUNTER — Other Ambulatory Visit: Payer: Self-pay | Admitting: Physician Assistant

## 2020-10-21 ENCOUNTER — Other Ambulatory Visit: Payer: Self-pay | Admitting: Physician Assistant

## 2020-11-01 ENCOUNTER — Other Ambulatory Visit: Payer: Self-pay | Admitting: Physician Assistant

## 2020-11-01 DIAGNOSIS — I10 Essential (primary) hypertension: Secondary | ICD-10-CM

## 2020-11-01 DIAGNOSIS — I5032 Chronic diastolic (congestive) heart failure: Secondary | ICD-10-CM

## 2020-11-01 DIAGNOSIS — R7303 Prediabetes: Secondary | ICD-10-CM

## 2020-11-01 DIAGNOSIS — Z125 Encounter for screening for malignant neoplasm of prostate: Secondary | ICD-10-CM

## 2020-11-01 DIAGNOSIS — Z13220 Encounter for screening for lipoid disorders: Secondary | ICD-10-CM

## 2020-11-01 MED ORDER — POTASSIUM CHLORIDE ER 10 MEQ PO TBCR: 10.0000 meq | EXTENDED_RELEASE_TABLET | Freq: Two times a day (BID) | ORAL | 0 refills | Status: DC

## 2020-11-01 MED ORDER — ALBUTEROL SULFATE HFA 108 (90 BASE) MCG/ACT IN AERS: INHALATION_SPRAY | RESPIRATORY_TRACT | 0 refills | Status: DC

## 2020-11-01 MED ORDER — METOPROLOL TARTRATE 50 MG PO TABS: 50.0000 mg | ORAL_TABLET | Freq: Two times a day (BID) | ORAL | 0 refills | Status: DC

## 2020-11-01 MED ORDER — LOSARTAN POTASSIUM 100 MG PO TABS: 100.0000 mg | ORAL_TABLET | Freq: Every day | ORAL | 0 refills | Status: DC

## 2020-11-02 ENCOUNTER — Other Ambulatory Visit (HOSPITAL_COMMUNITY)
Admission: RE | Admit: 2020-11-02 | Discharge: 2020-11-02 | Disposition: A | Discharge status: Home / Self Care | Payer: Self-pay | Source: Ambulatory Visit | Attending: Physician Assistant | Admitting: Physician Assistant

## 2020-11-02 ENCOUNTER — Other Ambulatory Visit: Payer: Self-pay

## 2020-11-02 DIAGNOSIS — I10 Essential (primary) hypertension: Secondary | ICD-10-CM | POA: Insufficient documentation

## 2020-11-02 DIAGNOSIS — Z125 Encounter for screening for malignant neoplasm of prostate: Secondary | ICD-10-CM | POA: Insufficient documentation

## 2020-11-02 DIAGNOSIS — I5032 Chronic diastolic (congestive) heart failure: Secondary | ICD-10-CM | POA: Insufficient documentation

## 2020-11-02 DIAGNOSIS — R7303 Prediabetes: Secondary | ICD-10-CM | POA: Insufficient documentation

## 2020-11-02 DIAGNOSIS — Z13220 Encounter for screening for lipoid disorders: Secondary | ICD-10-CM | POA: Insufficient documentation

## 2020-11-02 LAB — COMPREHENSIVE METABOLIC PANEL
ALT: 20 U/L (ref 0–44)
AST: 28 U/L (ref 15–41)
Albumin: 3.9 g/dL (ref 3.5–5.0)
Alkaline Phosphatase: 85 U/L (ref 38–126)
Anion gap: 8 (ref 5–15)
BUN: 9 mg/dL (ref 8–23)
CO2: 29 mmol/L (ref 22–32)
Calcium: 9.1 mg/dL (ref 8.9–10.3)
Chloride: 96 mmol/L — ABNORMAL LOW (ref 98–111)
Creatinine, Ser: 0.86 mg/dL (ref 0.61–1.24)
GFR, Estimated: >60 mL/min (ref >60)
Glucose, Bld: 107 mg/dL — ABNORMAL HIGH (ref 70–99)
Potassium: 4.2 mmol/L (ref 3.5–5.1)
Sodium: 133 mmol/L — ABNORMAL LOW (ref 135–145)
Total Bilirubin: 1.4 mg/dL — ABNORMAL HIGH (ref 0.3–1.2)
Total Protein: 7.4 g/dL (ref 6.5–8.1)

## 2020-11-02 LAB — HEMOGLOBIN A1C
Hgb A1c MFr Bld: 5.7 % — ABNORMAL HIGH (ref 4.8–5.6)
Mean Plasma Glucose: 116.89 mg/dL

## 2020-11-02 LAB — LIPID PANEL
Cholesterol: 157 mg/dL (ref 0–200)
HDL: 47 mg/dL (ref >40)
LDL Cholesterol: 92 mg/dL (ref 0–99)
Total CHOL/HDL Ratio: 3.3 RATIO
Triglycerides: 88 mg/dL (ref <150)
VLDL: 18 mg/dL (ref 0–40)

## 2020-11-02 LAB — PSA
Prostatic Specific Antigen: 1.09 ng/mL (ref 0.00–4.00)
Prostatic Specific Antigen: 1.11 ng/mL (ref 0.00–4.00)

## 2020-11-07 ENCOUNTER — Ambulatory Visit: Payer: Self-pay | Admitting: Physician Assistant

## 2020-11-10 ENCOUNTER — Encounter: Payer: Self-pay | Admitting: Physician Assistant

## 2020-11-10 ENCOUNTER — Ambulatory Visit: Payer: Self-pay | Admitting: Physician Assistant

## 2020-11-10 VITALS — BP 150/88 | HR 59 | Temp 98.1°F | Ht 72.0 in | Wt 323.0 lb

## 2020-11-10 DIAGNOSIS — F17200 Nicotine dependence, unspecified, uncomplicated: Secondary | ICD-10-CM

## 2020-11-10 DIAGNOSIS — R7303 Prediabetes: Secondary | ICD-10-CM

## 2020-11-10 DIAGNOSIS — J449 Chronic obstructive pulmonary disease, unspecified: Secondary | ICD-10-CM

## 2020-11-10 DIAGNOSIS — I482 Chronic atrial fibrillation, unspecified: Secondary | ICD-10-CM

## 2020-11-10 DIAGNOSIS — I10 Essential (primary) hypertension: Secondary | ICD-10-CM

## 2020-11-10 DIAGNOSIS — Z7901 Long term (current) use of anticoagulants: Secondary | ICD-10-CM

## 2020-11-10 DIAGNOSIS — F32A Depression, unspecified: Secondary | ICD-10-CM

## 2020-11-10 DIAGNOSIS — I5032 Chronic diastolic (congestive) heart failure: Secondary | ICD-10-CM

## 2020-11-10 MED ORDER — ALBUTEROL SULFATE HFA 108 (90 BASE) MCG/ACT IN AERS
INHALATION_SPRAY | RESPIRATORY_TRACT | 0 refills | Status: DC
Start: 2020-11-10 — End: 2021-04-26

## 2020-11-10 MED ORDER — CITALOPRAM HYDROBROMIDE 20 MG PO TABS: 20.0000 mg | ORAL_TABLET | Freq: Every day | ORAL | 0 refills | Status: DC

## 2020-11-10 MED ORDER — FUROSEMIDE 40 MG PO TABS
40.0000 mg | ORAL_TABLET | Freq: Every day | ORAL | 0 refills | Status: DC
Start: 2020-11-10 — End: 2021-01-08

## 2020-11-10 MED ORDER — RIVAROXABAN 20 MG PO TABS
20.0000 mg | ORAL_TABLET | Freq: Every day | ORAL | 1 refills | Status: DC
Start: 2020-11-10 — End: 2021-04-26

## 2020-11-10 MED ORDER — LOSARTAN POTASSIUM 100 MG PO TABS
100.0000 mg | ORAL_TABLET | Freq: Every day | ORAL | 0 refills | Status: DC
Start: 2020-11-10 — End: 2021-03-28

## 2020-11-10 MED ORDER — FLUTICASONE-SALMETEROL 100-50 MCG/DOSE IN AEPB: 1.0000 | INHALATION_SPRAY | Freq: Two times a day (BID) | RESPIRATORY_TRACT | 0 refills | Status: DC

## 2020-11-10 MED ORDER — METOPROLOL TARTRATE 50 MG PO TABS
50.0000 mg | ORAL_TABLET | Freq: Two times a day (BID) | ORAL | 0 refills | Status: DC
Start: 2020-11-10 — End: 2021-04-26

## 2020-11-10 MED ORDER — FLUTICASONE PROPIONATE 50 MCG/ACT NA SUSP: 2.0000 | Freq: Every day | NASAL | 0 refills | Status: DC

## 2020-11-10 MED ORDER — POTASSIUM CHLORIDE ER 10 MEQ PO TBCR
10.0000 meq | EXTENDED_RELEASE_TABLET | Freq: Two times a day (BID) | ORAL | 0 refills | Status: DC
Start: 2020-11-10 — End: 2021-04-26

## 2020-11-10 NOTE — Patient Instructions (Signed)
Fluticasone inhalation powder What is this medicine? FLUTICASONE (floo TIK a sone) inhalation powder is a corticosteroid. It helps decrease inflammation in your lungs. This medicine is used to treat the symptoms of asthma. Never use this medicine for an acute asthma attack. This medicine may be used for other purposes; ask your health care provider or pharmacist if you have questions. COMMON BRAND NAME(S): ArmonAir Digihaler, ARMONAIR RESPICLICK, ARNUITY ELLIPTA, Flovent Diskus, Flovent Rotadisk What should I tell my health care provider before I take this medicine? They need to know if you have any of these conditions:  bone problems  eye disease, vision problems  immune system problems  infection, like chickenpox, tuberculosis, herpes, or fungal infection  recent surgery or injury of mouth or throat  taking corticosteroids by mouth  an unusual or allergic reaction to fluticasone, steroids, other medicines, foods, dyes, or preservatives  pregnant or trying to get pregnant  breast-feeding How should I use this medicine? This medicine is for inhalation through the mouth. Rinse your mouth with water after use. Make sure not to swallow the water. Follow the directions on your prescription label. Do not use with a spacer device. Do not use more often than directed. Make sure that you are using your inhaler correctly. Ask you doctor or health care provider if you have any questions. Talk to your pediatrician regarding the use of this medicine in children. While this drug may be prescribed for children for selected conditions, precautions do apply. Overdosage: If you think you have taken too much of this medicine contact a poison control center or emergency room at once. NOTE: This medicine is only for you. Do not share this medicine with others. What if I miss a dose? If you miss a dose, use it as soon as you remember. If it is almost time for your next dose, use only that dose and continue  with your regular schedule, spacing doses evenly. Do not use double or extra doses. What may interact with this medicine?  antiviral medicines for HIV or AIDS  certain antibiotics like clarithromycin and telithromycin  certain medicines for fungal infections like ketoconazole, itraconazole, posaconazole, voriconazole  conivaptan  ketoconazole  nefazodone This list may not describe all possible interactions. Give your health care provider a list of all the medicines, herbs, non-prescription drugs, or dietary supplements you use. Also tell them if you smoke, drink alcohol, or use illegal drugs. Some items may interact with your medicine. What should I watch for while using this medicine? Visit your doctor or health care professional for regular checks on your progress. Check with your health care professional if your symptoms do not improve. If your symptoms get worse or if you need your short-acting inhalers more often, call your doctor right away. Do not stop taking your medicine unless your doctor tells you to. Do not come in contact with people who have the chickenpox or the measles while you are taking this medicine. If you do, call your doctor right away. What side effects may I notice from receiving this medicine? Side effects that you should report to your doctor or health care professional as soon as possible:  allergic reactions like skin rash, itching or hives  breathing problems  changes in vision  chest pain  fever, chills  flu-like symptoms  nausea, vomiting  unusually weak or tired  white patches or sores in the mouth or throat Side effects that usually do not require medical attention (report to your doctor or health care   professional if they continue or are bothersome):  cough  dry mouth  headache  sore throat This list may not describe all possible side effects. Call your doctor for medical advice about side effects. You may report side effects to FDA at  1-800-FDA-1088. Where should I keep my medicine? Keep out of the reach of children. Store at room temperature between 15 and 30 degrees C (59 and 86 degrees F). Keep dry. Protect from heat and direct sunlight. Discard 6 weeks after removal from the foil pouch or after all of the blisters have been used (when the dose indicator reads 0), whichever comes first. NOTE: This sheet is a summary. It may not cover all possible information. If you have questions about this medicine, talk to your doctor, pharmacist, or health care provider.  2020 Elsevier/Gold Standard (2018-09-17 11:39:51)

## 2020-11-10 NOTE — Progress Notes (Signed)
BP (!) 150/88   Pulse (!) 59   Temp 98.1 F (36.7 C)   Ht 6' (1.829 m)   Wt (!) 323 lb (146.5 kg)   SpO2 95%   BMI 43.81 kg/m    Subjective:    Patient ID: Adam Marquez, male    DOB: 1956-02-17, 64 y.o.   MRN: [id number]  HPI: Norlan Rann is a 64 y.o. male presenting on 11/10/2020 for Follow-up   HPI   Pt had a negative covid 19 screening questionnaire.   Pt is 64yoM who has not been seen in office since March.  He has missed follow up appointments.  He no longer monitors his bp at home.    He is not following up with cardiology.  He says it is due to financial issues.    He has never applied for CAFA/[place] financial assistance.   He is Using inhaler at least 1/d up to more than 5/d  Pt is having a hard time accepting help since he previously always took care of himself.  He says he has thousands owed to the hospital.      Relevant past medical, surgical, family and social history reviewed and updated as indicated. Interim medical history since our last visit reviewed. Allergies and medications reviewed and updated.    Current Outpatient Medications:  .  acetaminophen (TYLENOL) 500 MG tablet, Take 500 mg by mouth every 6 (six) hours as needed., Disp: , Rfl:  .  albuterol (PROVENTIL HFA) 108 (90 Base) MCG/ACT inhaler, INHALE 2 PUFFS BY MOUTH EVERY 4 HOURS AS NEEDED FOR COUGHING, WHEEZING, OR SHORTNESS OF BREATH, Disp: 6.7 g, Rfl: 0 .  furosemide (LASIX) 40 MG tablet, TAKE 1 TABLET BY MOUTH TWICE DAILY, Disp: 60 tablet, Rfl: 5 .  metoprolol tartrate (LOPRESSOR) 50 MG tablet, Take 1 tablet (50 mg total) by mouth 2 (two) times daily., Disp: 28 tablet, Rfl: 0 .  oxymetazoline (AFRIN) 0.05 % nasal spray, Place 2 sprays into both nostrils as needed for congestion., Disp: , Rfl:  .  Polyvinyl Alcohol-Povidone (CLEAR EYES ALL SEASONS) 5-6 MG/ML SOLN, Apply 1 drop to eye daily as needed (for allergies)., Disp: , Rfl:  .  rivaroxaban (XARELTO) 20 MG TABS  tablet, Take 1 tablet (20 mg total) by mouth daily with supper., Disp: 30 tablet, Rfl: 11 .  diltiazem (CARDIZEM) 120 MG tablet, Take 120 mg by mouth 4 (four) times daily. (Patient not taking: Reported on 11/10/2020), Disp: , Rfl:  .  losartan (COZAAR) 100 MG tablet, Take 1 tablet (100 mg total) by mouth daily. (Patient not taking: Reported on 11/10/2020), Disp: 14 tablet, Rfl: 0 .  potassium chloride (KLOR-CON) 10 MEQ tablet, Take 1 tablet (10 mEq total) by mouth 2 (two) times daily. (Patient not taking: Reported on 11/10/2020), Disp: 28 tablet, Rfl: 0     Review of Systems  Per HPI unless specifically indicated above     Objective:    BP (!) 150/88   Pulse (!) 59   Temp 98.1 F (36.7 C)   Ht 6' (1.829 m)   Wt (!) 323 lb (146.5 kg)   SpO2 95%   BMI 43.81 kg/m   Wt Readings from Last 3 Encounters:  11/10/20 (!) 323 lb (146.5 kg)  03/01/20 (!) 327 lb (148.3 kg)  11/19/19 298 lb (135.2 kg)    Physical Exam Vitals reviewed.  Constitutional:      General: He is not in acute distress.    Appearance: He is  well-developed. He is obese.  HENT:     Head: Normocephalic and atraumatic.  Cardiovascular:     Rate and Rhythm: Normal rate. Rhythm irregular.  Pulmonary:     Effort: Pulmonary effort is normal.     Breath sounds: Normal breath sounds. No wheezing.  Abdominal:     General: Bowel sounds are normal.     Palpations: Abdomen is soft.     Tenderness: There is no abdominal tenderness.  Musculoskeletal:     Cervical back: Neck supple.     Right lower leg: Edema present.     Left lower leg: Edema present.  Lymphadenopathy:     Cervical: No cervical adenopathy.  Skin:    General: Skin is warm and dry.  Neurological:     Mental Status: He is alert and oriented to person, place, and time.  Psychiatric:        Attention and Perception: Attention normal.        Mood and Affect: Affect is tearful.        Speech: Speech normal.        Behavior: Behavior normal. Behavior is  cooperative.     Results for orders placed or performed during the hospital encounter of 11/02/20  PSA  Result Value Ref Range   Prostatic Specific Antigen 1.11 0.00 - 4.00 ng/mL  Lipid panel  Result Value Ref Range   Cholesterol 157 0 - 200 mg/dL   Triglycerides 88 <324 mg/dL   HDL 47 >40 mg/dL   Total CHOL/HDL Ratio 3.3 RATIO   VLDL 18 0 - 40 mg/dL   LDL Cholesterol 92 0 - 99 mg/dL  Hemoglobin N0U  Result Value Ref Range   Hgb A1c MFr Bld 5.7 (H) 4.8 - 5.6 %   Mean Plasma Glucose 116.89 mg/dL  Comprehensive metabolic panel  Result Value Ref Range   Sodium 133 (L) 135 - 145 mmol/L   Potassium 4.2 3.5 - 5.1 mmol/L   Chloride 96 (L) 98 - 111 mmol/L   CO2 29 22 - 32 mmol/L   Glucose, Bld 107 (H) 70 - 99 mg/dL   BUN 9 8 - 23 mg/dL   Creatinine, Ser 7.25 0.61 - 1.24 mg/dL   Calcium 9.1 8.9 - 36.6 mg/dL   Total Protein 7.4 6.5 - 8.1 g/dL   Albumin 3.9 3.5 - 5.0 g/dL   AST 28 15 - 41 U/L   ALT 20 0 - 44 U/L   Alkaline Phosphatase 85 38 - 126 U/L   Total Bilirubin 1.4 (H) 0.3 - 1.2 mg/dL   GFR, Estimated >44 >03 mL/min   Anion gap 8 5 - 15  PSA  Result Value Ref Range   Prostatic Specific Antigen 1.09 0.00 - 4.00 ng/mL      Assessment & Plan:    Encounter Diagnoses  Name Primary?  . Essential hypertension Yes  . Chronic diastolic heart failure ([facility])   . Chronic atrial fibrillation ([facility])   . Anticoagulated   . Prediabetes   . Morbid obesity ([facility])   . Depression, unspecified depression type   . Chronic obstructive pulmonary disease, unspecified COPD type ([facility])   . Tobacco use disorder      -reviewed labs with pt -Will have care connect contact pt  To help with CAFA/ assistance application -Refills sent to [facility] -will Add advair.  He is counseled on how to use it -will send rx for flonase to replace afrin.  Discussed with pt about negative effects of long-term afrin use.  -  trial of citalopram to help with mood -discussed with pt that he needs to follow up  with cardiology.  He should call to schedule an appointment -pt will follow up 3 wk to reckeck mood.  He is to contact office sooner prn

## 2020-12-06 ENCOUNTER — Ambulatory Visit: Payer: Self-pay | Admitting: Physician Assistant

## 2021-01-03 ENCOUNTER — Inpatient Hospital Stay (HOSPITAL_COMMUNITY)
Admission: EM | Admit: 2021-01-03 | Discharge: 2021-01-08 | DRG: 190 | Disposition: A | Discharge status: Home / Self Care | Payer: Self-pay | Attending: Internal Medicine | Admitting: Internal Medicine

## 2021-01-03 ENCOUNTER — Emergency Department (HOSPITAL_COMMUNITY): Payer: Self-pay

## 2021-01-03 ENCOUNTER — Other Ambulatory Visit: Payer: Self-pay

## 2021-01-03 ENCOUNTER — Encounter (HOSPITAL_COMMUNITY): Payer: Self-pay | Admitting: *Deleted

## 2021-01-03 ENCOUNTER — Observation Stay (HOSPITAL_BASED_OUTPATIENT_CLINIC_OR_DEPARTMENT_OTHER): Payer: Self-pay

## 2021-01-03 DIAGNOSIS — R739 Hyperglycemia, unspecified: Secondary | ICD-10-CM | POA: Diagnosis present

## 2021-01-03 DIAGNOSIS — I110 Hypertensive heart disease with heart failure: Secondary | ICD-10-CM | POA: Diagnosis present

## 2021-01-03 DIAGNOSIS — R7989 Other specified abnormal findings of blood chemistry: Secondary | ICD-10-CM

## 2021-01-03 DIAGNOSIS — R0603 Acute respiratory distress: Secondary | ICD-10-CM

## 2021-01-03 DIAGNOSIS — Z8249 Family history of ischemic heart disease and other diseases of the circulatory system: Secondary | ICD-10-CM

## 2021-01-03 DIAGNOSIS — T380X5A Adverse effect of glucocorticoids and synthetic analogues, initial encounter: Secondary | ICD-10-CM | POA: Diagnosis present

## 2021-01-03 DIAGNOSIS — I5033 Acute on chronic diastolic (congestive) heart failure: Secondary | ICD-10-CM

## 2021-01-03 DIAGNOSIS — Z79899 Other long term (current) drug therapy: Secondary | ICD-10-CM

## 2021-01-03 DIAGNOSIS — I4819 Other persistent atrial fibrillation: Secondary | ICD-10-CM | POA: Diagnosis present

## 2021-01-03 DIAGNOSIS — E871 Hypo-osmolality and hyponatremia: Secondary | ICD-10-CM | POA: Diagnosis present

## 2021-01-03 DIAGNOSIS — I5032 Chronic diastolic (congestive) heart failure: Secondary | ICD-10-CM

## 2021-01-03 DIAGNOSIS — E785 Hyperlipidemia, unspecified: Secondary | ICD-10-CM | POA: Diagnosis present

## 2021-01-03 DIAGNOSIS — J129 Viral pneumonia, unspecified: Secondary | ICD-10-CM

## 2021-01-03 DIAGNOSIS — J209 Acute bronchitis, unspecified: Secondary | ICD-10-CM

## 2021-01-03 DIAGNOSIS — J9600 Acute respiratory failure, unspecified whether with hypoxia or hypercapnia: Secondary | ICD-10-CM

## 2021-01-03 DIAGNOSIS — I493 Ventricular premature depolarization: Secondary | ICD-10-CM | POA: Diagnosis present

## 2021-01-03 DIAGNOSIS — I252 Old myocardial infarction: Secondary | ICD-10-CM

## 2021-01-03 DIAGNOSIS — I248 Other forms of acute ischemic heart disease: Secondary | ICD-10-CM

## 2021-01-03 DIAGNOSIS — Z6841 Body Mass Index (BMI) 40.0 and over, adult: Secondary | ICD-10-CM

## 2021-01-03 DIAGNOSIS — F17210 Nicotine dependence, cigarettes, uncomplicated: Secondary | ICD-10-CM | POA: Diagnosis present

## 2021-01-03 DIAGNOSIS — I5031 Acute diastolic (congestive) heart failure: Secondary | ICD-10-CM | POA: Diagnosis present

## 2021-01-03 DIAGNOSIS — E1165 Type 2 diabetes mellitus with hyperglycemia: Secondary | ICD-10-CM | POA: Diagnosis present

## 2021-01-03 DIAGNOSIS — J449 Chronic obstructive pulmonary disease, unspecified: Secondary | ICD-10-CM

## 2021-01-03 DIAGNOSIS — R778 Other specified abnormalities of plasma proteins: Secondary | ICD-10-CM

## 2021-01-03 DIAGNOSIS — E66813 Obesity, class 3: Secondary | ICD-10-CM | POA: Diagnosis present

## 2021-01-03 DIAGNOSIS — Z720 Tobacco use: Secondary | ICD-10-CM

## 2021-01-03 DIAGNOSIS — J440 Chronic obstructive pulmonary disease with acute lower respiratory infection: Secondary | ICD-10-CM

## 2021-01-03 DIAGNOSIS — J9601 Acute respiratory failure with hypoxia: Secondary | ICD-10-CM

## 2021-01-03 DIAGNOSIS — Z833 Family history of diabetes mellitus: Secondary | ICD-10-CM

## 2021-01-03 DIAGNOSIS — I10 Essential (primary) hypertension: Secondary | ICD-10-CM | POA: Diagnosis present

## 2021-01-03 DIAGNOSIS — Z20822 Contact with and (suspected) exposure to covid-19: Secondary | ICD-10-CM | POA: Diagnosis present

## 2021-01-03 DIAGNOSIS — D696 Thrombocytopenia, unspecified: Secondary | ICD-10-CM | POA: Diagnosis present

## 2021-01-03 DIAGNOSIS — Z7901 Long term (current) use of anticoagulants: Secondary | ICD-10-CM

## 2021-01-03 DIAGNOSIS — Y929 Unspecified place or not applicable: Secondary | ICD-10-CM

## 2021-01-03 DIAGNOSIS — Z23 Encounter for immunization: Secondary | ICD-10-CM

## 2021-01-03 DIAGNOSIS — Z8679 Personal history of other diseases of the circulatory system: Secondary | ICD-10-CM

## 2021-01-03 DIAGNOSIS — J441 Chronic obstructive pulmonary disease with (acute) exacerbation: Principal | ICD-10-CM

## 2021-01-03 DIAGNOSIS — E11649 Type 2 diabetes mellitus with hypoglycemia without coma: Secondary | ICD-10-CM | POA: Diagnosis not present

## 2021-01-03 DIAGNOSIS — D539 Nutritional anemia, unspecified: Secondary | ICD-10-CM | POA: Diagnosis present

## 2021-01-03 DIAGNOSIS — Z888 Allergy status to other drugs, medicaments and biological substances status: Secondary | ICD-10-CM

## 2021-01-03 DIAGNOSIS — I480 Paroxysmal atrial fibrillation: Secondary | ICD-10-CM

## 2021-01-03 HISTORY — DX: Type 2 diabetes mellitus without complications: E11.9

## 2021-01-03 LAB — CBC WITH DIFFERENTIAL/PLATELET
Abs Immature Granulocytes: 0.01 10*3/uL (ref 0.00–0.07)
Basophils Absolute: 0 10*3/uL (ref 0.0–0.1)
Basophils Relative: 1 %
Eosinophils Absolute: 0 10*3/uL (ref 0.0–0.5)
Eosinophils Relative: 1 %
HCT: 53.5 % — ABNORMAL HIGH (ref 39.0–52.0)
Hemoglobin: 17.6 g/dL — ABNORMAL HIGH (ref 13.0–17.0)
Immature Granulocytes: 0 %
Lymphocytes Relative: 12 %
Lymphs Abs: 0.6 10*3/uL — ABNORMAL LOW (ref 0.7–4.0)
MCH: 33.9 pg (ref 26.0–34.0)
MCHC: 32.9 g/dL (ref 30.0–36.0)
MCV: 103.1 fL — ABNORMAL HIGH (ref 80.0–100.0)
Monocytes Absolute: 0.4 10*3/uL (ref 0.1–1.0)
Monocytes Relative: 8 %
Neutro Abs: 3.9 10*3/uL (ref 1.7–7.7)
Neutrophils Relative %: 78 %
Platelets: 107 10*3/uL — ABNORMAL LOW (ref 150–400)
RBC: 5.19 MIL/uL (ref 4.22–5.81)
RDW: 14.3 % (ref 11.5–15.5)
WBC: 4.9 10*3/uL (ref 4.0–10.5)
nRBC: 0 % (ref 0.0–0.2)

## 2021-01-03 LAB — POC SARS CORONAVIRUS 2 AG -  ED
SARS Coronavirus 2 Ag: NEGATIVE
SARS Coronavirus 2 Ag: POSITIVE — AB

## 2021-01-03 LAB — GLUCOSE, CAPILLARY
Glucose-Capillary: 191 mg/dL — ABNORMAL HIGH (ref 70–99)
Glucose-Capillary: 212 mg/dL — ABNORMAL HIGH (ref 70–99)
Glucose-Capillary: 195 mg/dL — ABNORMAL HIGH (ref 70–99)
Glucose-Capillary: 149 mg/dL — ABNORMAL HIGH (ref 70–99)

## 2021-01-03 LAB — POC SARS CORONAVIRUS 2 AG

## 2021-01-03 LAB — ECHOCARDIOGRAM COMPLETE
Area-P 1/2: 3.17 cm2
Height: 72 in
S' Lateral: 4.1 cm
Weight: 4910.09 oz

## 2021-01-03 LAB — RESP PANEL BY RT-PCR (FLU A&B, COVID) ARPGX2
Influenza A by PCR: NEGATIVE
Influenza B by PCR: NEGATIVE
SARS Coronavirus 2 by RT PCR: NEGATIVE

## 2021-01-03 LAB — COMPREHENSIVE METABOLIC PANEL
ALT: 16 U/L (ref 0–44)
AST: 28 U/L (ref 15–41)
Albumin: 4.2 g/dL (ref 3.5–5.0)
Alkaline Phosphatase: 74 U/L (ref 38–126)
Anion gap: 15 (ref 5–15)
BUN: 10 mg/dL (ref 8–23)
CO2: 24 mmol/L (ref 22–32)
Calcium: 9.4 mg/dL (ref 8.9–10.3)
Chloride: 93 mmol/L — ABNORMAL LOW (ref 98–111)
Creatinine, Ser: 1.1 mg/dL (ref 0.61–1.24)
GFR, Estimated: >60 mL/min (ref >60)
Glucose, Bld: 120 mg/dL — ABNORMAL HIGH (ref 70–99)
Potassium: 4.1 mmol/L (ref 3.5–5.1)
Sodium: 132 mmol/L — ABNORMAL LOW (ref 135–145)
Total Bilirubin: 1.2 mg/dL (ref 0.3–1.2)
Total Protein: 8 g/dL (ref 6.5–8.1)

## 2021-01-03 LAB — HIV ANTIBODY (ROUTINE TESTING W REFLEX): HIV Screen 4th Generation wRfx: NONREACTIVE

## 2021-01-03 LAB — T4, FREE: Free T4: 1.09 ng/dL (ref 0.61–1.12)

## 2021-01-03 LAB — TROPONIN I (HIGH SENSITIVITY)
Troponin I (High Sensitivity): 275 ng/L (ref <18)
Troponin I (High Sensitivity): 424 ng/L (ref <18)
Troponin I (High Sensitivity): 521 ng/L (ref <18)
Troponin I (High Sensitivity): 463 ng/L (ref <18)

## 2021-01-03 LAB — TRIGLYCERIDES: Triglycerides: 96 mg/dL (ref <150)

## 2021-01-03 LAB — BRAIN NATRIURETIC PEPTIDE: B Natriuretic Peptide: 456 pg/mL — ABNORMAL HIGH (ref 0.0–100.0)

## 2021-01-03 LAB — PROCALCITONIN: Procalcitonin: 0.19 ng/mL

## 2021-01-03 LAB — D-DIMER, QUANTITATIVE: D-Dimer, Quant: 0.42 ug/mL-FEU (ref 0.00–0.50)

## 2021-01-03 LAB — LACTIC ACID, PLASMA
Lactic Acid, Venous: 1.8 mmol/L (ref 0.5–1.9)
Lactic Acid, Venous: 1.6 mmol/L (ref 0.5–1.9)

## 2021-01-03 LAB — LACTATE DEHYDROGENASE: LDH: 170 U/L (ref 98–192)

## 2021-01-03 LAB — FERRITIN: Ferritin: 171 ng/mL (ref 24–336)

## 2021-01-03 LAB — HEMOGLOBIN A1C
Hgb A1c MFr Bld: 5.8 % — ABNORMAL HIGH (ref 4.8–5.6)
Mean Plasma Glucose: 119.76 mg/dL

## 2021-01-03 LAB — FIBRINOGEN: Fibrinogen: 577 mg/dL — ABNORMAL HIGH (ref 210–475)

## 2021-01-03 LAB — C-REACTIVE PROTEIN: CRP: 9.3 mg/dL — ABNORMAL HIGH (ref <1.0)

## 2021-01-03 LAB — TSH: TSH: 1.909 u[IU]/mL (ref 0.350–4.500)

## 2021-01-03 LAB — VITAMIN B12: Vitamin B-12: 233 pg/mL (ref 180–914)

## 2021-01-03 LAB — FOLATE: Folate: 12.9 ng/mL (ref >5.9)

## 2021-01-03 MED ORDER — METHYLPREDNISOLONE SODIUM SUCC 125 MG IJ SOLR
60.0000 mg | Freq: Two times a day (BID) | INTRAMUSCULAR | Status: DC
  Administered 2021-01-03 – 2021-01-08 (×11): 60 mg via INTRAVENOUS
  Filled 2021-01-03 (×11): qty 2

## 2021-01-03 MED ORDER — DEXAMETHASONE SODIUM PHOSPHATE 10 MG/ML IJ SOLN
10.0000 mg | Freq: Once | INTRAMUSCULAR | Status: AC
  Administered 2021-01-03: 10 mg via INTRAVENOUS
  Filled 2021-01-03: qty 1

## 2021-01-03 MED ORDER — FUROSEMIDE 40 MG PO TABS
40.0000 mg | ORAL_TABLET | Freq: Every day | ORAL | Status: DC
  Administered 2021-01-03 – 2021-01-04 (×2): 40 mg via ORAL
  Filled 2021-01-03 (×3): qty 1

## 2021-01-03 MED ORDER — ALBUTEROL SULFATE HFA 108 (90 BASE) MCG/ACT IN AERS
6.0000 | INHALATION_SPRAY | Freq: Once | RESPIRATORY_TRACT | Status: AC
  Administered 2021-01-03: 6 via RESPIRATORY_TRACT

## 2021-01-03 MED ORDER — INSULIN ASPART 100 UNIT/ML ~~LOC~~ SOLN: 0.0000 [IU] | Freq: Every day | SUBCUTANEOUS | Status: DC

## 2021-01-03 MED ORDER — ACETAMINOPHEN 650 MG RE SUPP: 650.0000 mg | Freq: Four times a day (QID) | RECTAL | Status: DC | PRN

## 2021-01-03 MED ORDER — RIVAROXABAN 20 MG PO TABS
20.0000 mg | ORAL_TABLET | Freq: Every day | ORAL | Status: DC
  Administered 2021-01-03 – 2021-01-07 (×5): 20 mg via ORAL
  Filled 2021-01-03 (×5): qty 1

## 2021-01-03 MED ORDER — PNEUMOCOCCAL VAC POLYVALENT 25 MCG/0.5ML IJ INJ
0.5000 mL | INJECTION | INTRAMUSCULAR | Status: AC
  Administered 2021-01-04: 0.5 mL via INTRAMUSCULAR
  Filled 2021-01-03: qty 0.5

## 2021-01-03 MED ORDER — BUDESONIDE 0.5 MG/2ML IN SUSP
0.5000 mg | Freq: Two times a day (BID) | RESPIRATORY_TRACT | Status: DC
  Administered 2021-01-03 – 2021-01-08 (×11): 0.5 mg via RESPIRATORY_TRACT
  Filled 2021-01-03 (×11): qty 2

## 2021-01-03 MED ORDER — CITALOPRAM HYDROBROMIDE 20 MG PO TABS
20.0000 mg | ORAL_TABLET | Freq: Every day | ORAL | Status: DC
  Administered 2021-01-03 – 2021-01-08 (×6): 20 mg via ORAL
  Filled 2021-01-03 (×6): qty 1

## 2021-01-03 MED ORDER — SODIUM CHLORIDE 0.9 % IV SOLN
1.0000 g | Freq: Three times a day (TID) | INTRAVENOUS | Status: DC
  Administered 2021-01-03 – 2021-01-04 (×5): 1 g via INTRAVENOUS
  Filled 2021-01-03 (×5): qty 1

## 2021-01-03 MED ORDER — AZITHROMYCIN 250 MG PO TABS: 250.0000 mg | ORAL_TABLET | Freq: Every day | ORAL | Status: DC

## 2021-01-03 MED ORDER — AZITHROMYCIN 250 MG PO TABS: 500.0000 mg | ORAL_TABLET | Freq: Every day | ORAL | Status: DC

## 2021-01-03 MED ORDER — ONDANSETRON HCL 4 MG PO TABS: 4.0000 mg | ORAL_TABLET | Freq: Four times a day (QID) | ORAL | Status: DC | PRN

## 2021-01-03 MED ORDER — ONDANSETRON HCL 4 MG/2ML IJ SOLN: 4.0000 mg | Freq: Four times a day (QID) | INTRAMUSCULAR | Status: DC | PRN

## 2021-01-03 MED ORDER — METHYLPREDNISOLONE SODIUM SUCC 40 MG IJ SOLR: 40.0000 mg | Freq: Two times a day (BID) | INTRAMUSCULAR | Status: DC

## 2021-01-03 MED ORDER — ALBUTEROL SULFATE HFA 108 (90 BASE) MCG/ACT IN AERS
6.0000 | INHALATION_SPRAY | Freq: Once | RESPIRATORY_TRACT | Status: AC
  Administered 2021-01-03: 6 via RESPIRATORY_TRACT
  Filled 2021-01-03: qty 6.7

## 2021-01-03 MED ORDER — ALBUTEROL (5 MG/ML) CONTINUOUS INHALATION SOLN
10.0000 mg/h | INHALATION_SOLUTION | Freq: Once | RESPIRATORY_TRACT | Status: AC
  Administered 2021-01-03: 10 mg/h via RESPIRATORY_TRACT
  Filled 2021-01-03: qty 20

## 2021-01-03 MED ORDER — DM-GUAIFENESIN ER 30-600 MG PO TB12
1.0000 | ORAL_TABLET | Freq: Two times a day (BID) | ORAL | Status: DC
  Administered 2021-01-03 – 2021-01-08 (×11): 1 via ORAL
  Filled 2021-01-03 (×11): qty 1

## 2021-01-03 MED ORDER — PANTOPRAZOLE SODIUM 40 MG IV SOLR
40.0000 mg | INTRAVENOUS | Status: DC
  Administered 2021-01-03 – 2021-01-04 (×2): 40 mg via INTRAVENOUS
  Filled 2021-01-03 (×2): qty 40

## 2021-01-03 MED ORDER — ACETAMINOPHEN 325 MG PO TABS: 650.0000 mg | ORAL_TABLET | Freq: Four times a day (QID) | ORAL | Status: DC | PRN

## 2021-01-03 MED ORDER — AEROCHAMBER Z-STAT PLUS/MEDIUM MISC
1.0000 | Freq: Once | Status: AC
  Administered 2021-01-03: 1

## 2021-01-03 MED ORDER — LEVALBUTEROL HCL 0.63 MG/3ML IN NEBU
0.6300 mg | INHALATION_SOLUTION | Freq: Four times a day (QID) | RESPIRATORY_TRACT | Status: DC
  Administered 2021-01-03 – 2021-01-05 (×9): 0.63 mg via RESPIRATORY_TRACT
  Filled 2021-01-03 (×10): qty 3

## 2021-01-03 MED ORDER — INSULIN ASPART 100 UNIT/ML ~~LOC~~ SOLN
0.0000 [IU] | Freq: Three times a day (TID) | SUBCUTANEOUS | Status: DC
  Administered 2021-01-03: 5 [IU] via SUBCUTANEOUS
  Administered 2021-01-03 – 2021-01-04 (×2): 3 [IU] via SUBCUTANEOUS
  Administered 2021-01-04 (×2): 5 [IU] via SUBCUTANEOUS
  Administered 2021-01-05: 3 [IU] via SUBCUTANEOUS
  Administered 2021-01-05: 8 [IU] via SUBCUTANEOUS
  Administered 2021-01-05: 3 [IU] via SUBCUTANEOUS
  Administered 2021-01-06: 5 [IU] via SUBCUTANEOUS
  Administered 2021-01-06: 2 [IU] via SUBCUTANEOUS
  Administered 2021-01-07: 5 [IU] via SUBCUTANEOUS
  Administered 2021-01-07: 3 [IU] via SUBCUTANEOUS
  Administered 2021-01-07: 2 [IU] via SUBCUTANEOUS
  Administered 2021-01-08 (×2): 3 [IU] via SUBCUTANEOUS

## 2021-01-03 MED ORDER — INFLUENZA VAC SPLIT QUAD 0.5 ML IM SUSY
0.5000 mL | PREFILLED_SYRINGE | INTRAMUSCULAR | Status: AC
  Administered 2021-01-04: 0.5 mL via INTRAMUSCULAR
  Filled 2021-01-03: qty 0.5

## 2021-01-03 MED ORDER — IPRATROPIUM BROMIDE 0.02 % IN SOLN
0.5000 mg | Freq: Four times a day (QID) | RESPIRATORY_TRACT | Status: DC
  Administered 2021-01-03 – 2021-01-05 (×9): 0.5 mg via RESPIRATORY_TRACT
  Filled 2021-01-03 (×10): qty 2.5

## 2021-01-03 MED ORDER — LEVALBUTEROL HCL 0.63 MG/3ML IN NEBU: 0.6300 mg | INHALATION_SOLUTION | Freq: Four times a day (QID) | RESPIRATORY_TRACT | Status: DC | PRN

## 2021-01-03 NOTE — ED Notes (Signed)
Confirmed with Dr. Arbutus Leas, ok for tele, no further orders for heparin gtt at this time.

## 2021-01-03 NOTE — Progress Notes (Signed)
PROGRESS NOTE  Adam Marquez XFG:[id number] DOB: Sep 16, 1956 DOA: 01/03/2021 PCP: Jacquelin Hawking, PA-C  Brief History:  65 year old male with a history of hypertension, tobacco abuse/COPD, persistent atrial fibrillation status post DCCV 10/29/2019, diastolic CHF, morbid obesity presenting with 3-day history of worsening shortness of breath, coughing, and fever up to 101.6110F.  Patient states that he has continued to smoke approximately up to 1/2 pack/day.  He has a 40-pack-year history.  He states that he is coughing white sputum.  He denies any hemoptysis.  He feels that his lower extremity edema is better than usual.  He has been trying to lose weight and states that he feels that his weight is better than the weight that was obtained at his PCPs office on 11/10/2020 which was 323 pounds.  However, the patient states that he usually skips his furosemide dosing at least 1 day/week.  He denies any chest pain, nausea, vomiting, diarrhea, abdominal pain, dysuria, hematuria. Notably, the patient was admitted to the hospital back in October 2020 for acute diastolic CHF.  His discharge weight was 292 pounds at that time.  He has been vaccinated with mammogram x2.  He states that he has been drinking approximately a 12 pack of 12 ounce beers once per week.  His last alcohol was 5 days prior to this admission. In the emergency department, the patient was febrile up to 100.110F.  He was tachycardic into the 120s.  His blood pressure was soft with systolic BP 100-110.  Oxygen saturation was 91-93% on room air.  Assessment/Plan: Acute respiratory failure with hypoxia -Oxygen saturation 99% on room air -Secondary to COPD exacerbation, possibly viral infection -Currently stable on 2 L -Wean oxygen as tolerated for saturation greater 92% -D-dimer 0.42 -Personally reviewed chest x-ray--increased interstitial markings  COPD exacerbation -Start Solu-Medrol 60 mg IV twice daily -Start  Pulmicort -Xopenex and Atrovent nebulizers -COVID-19 PCR--neg -check viral respiratory panel  SIRS -presented with fever, tachycardia, tachypnea -likely due to pulmonary process -follow blood cultures -start empiric merrem -UA and urine culture -Lactic peaked 1.8 -PCT 0.19  Elevated troponin -Secondary to demand ischemia -Patient does not have any chest discomfort -Personally reviewed EKG--sinus, nonspecific TWI -Echo -cardiology consult  Chronic diastolic CHF -Continue furosemide 40 mg twice daily po -Daily weights -Accurate I's and O's -10/29/19 TEE--EF 60-65%, mild TR, global low normal RV function  Persistent Afib -s/p DCCV 10/29/19 -continue Xarelto -currently in sinus  Thrombocytopenia -D-dimer 0.42 -Serum B12 -TSH -Folic acid -Likely due to infectious process  Hypertension -no longer on diltiazem -holding metoprolol due to soft BP      Status is: Observation  The patient will require care spanning > 2 midnights and should be moved to inpatient because: IV treatments appropriate due to intensity of illness or inability to take PO  Dispo: The patient is from: Home              Anticipated d/c is to: Home              Anticipated d/c date is: 2 days              Patient currently is not medically stable to d/c.        Family Communication:  No Family at bedside  Consultants:  cardiology  Code Status:  FULL   DVT Prophylaxis:  Xarelto   Procedures: As Listed in Progress Note Above  Antibiotics: merrem 1/4>>     Subjective: Patient  states that his breathing is little better than last night but remains short of breath.  He denies any headache, chest pain, nausea, vomiting, diarrhea, abdominal pain, hemoptysis.  Objective: Vitals:   01/03/21 0537 01/03/21 0600 01/03/21 0640 01/03/21 0650  BP:  110/78  108/75  Pulse:  (!) 125 (!) 122 (!) 112  Resp:  (!) 30 (!) 26 (!) 21  Temp: (!) 100.4 F (38 C)     TempSrc: Oral     SpO2:   93% 95% 91%  Weight:      Height:       No intake or output data in the 24 hours ending 01/03/21 0721 Weight change:  Exam:   General:  Pt is alert, follows commands appropriately, not in acute distress  HEENT: No icterus, No thrush, No neck mass, [place]/AT  Cardiovascular: RRR, S1/S2, no rubs, no gallops  Respiratory: Bilateral scattered rales.  Bibasilar wheeze.  Good air movement  Abdomen: Soft/+BS, non tender, non distended, no guarding  Extremities: 1+LE edema, No lymphangitis, No petechiae, No rashes, no synovitis   Data Reviewed: I have personally reviewed following labs and imaging studies Basic Metabolic Panel: Recent Labs  Lab 01/03/21 0243  NA 132*  K 4.1  CL 93*  CO2 24  GLUCOSE 120*  BUN 10  CREATININE 1.10  CALCIUM 9.4   Liver Function Tests: Recent Labs  Lab 01/03/21 0243  AST 28  ALT 16  ALKPHOS 74  BILITOT 1.2  PROT 8.0  ALBUMIN 4.2   No results for input(s): LIPASE, AMYLASE in the last 168 hours. No results for input(s): AMMONIA in the last 168 hours. Coagulation Profile: No results for input(s): INR, PROTIME in the last 168 hours. CBC: Recent Labs  Lab 01/03/21 0243  WBC 4.9  NEUTROABS 3.9  HGB 17.6*  HCT 53.5*  MCV 103.1*  PLT 107*   Cardiac Enzymes: No results for input(s): CKTOTAL, CKMB, CKMBINDEX, TROPONINI in the last 168 hours. BNP: Invalid input(s): POCBNP CBG: No results for input(s): GLUCAP in the last 168 hours. HbA1C: No results for input(s): HGBA1C in the last 72 hours. Urine analysis:    Component Value Date/Time   COLORURINE YELLOW 10/22/2019 2237   APPEARANCEUR CLEAR 10/22/2019 2237   LABSPEC 1.017 10/22/2019 2237   PHURINE 6.0 10/22/2019 2237   GLUCOSEU NEGATIVE 10/22/2019 2237   HGBUR NEGATIVE 10/22/2019 2237   BILIRUBINUR NEGATIVE 10/22/2019 2237   KETONESUR 5 (A) 10/22/2019 2237   PROTEINUR 30 (A) 10/22/2019 2237   NITRITE NEGATIVE 10/22/2019 2237   LEUKOCYTESUR NEGATIVE 10/22/2019 2237   Sepsis  Labs: @LABRCNTIP (procalcitonin:4,lacticidven:4) ) Recent Results (from the past 240 hour(s))  Resp Panel by RT-PCR (Flu A&B, Covid) Nasopharyngeal Swab     Status: None   Collection Time: 01/03/21  2:47 AM   Specimen: Nasopharyngeal Swab; Nasopharyngeal(NP) swabs in vial transport medium  Result Value Ref Range Status   SARS Coronavirus 2 by RT PCR NEGATIVE NEGATIVE Final    Comment: (NOTE) SARS-CoV-2 target nucleic acids are NOT DETECTED.  The SARS-CoV-2 RNA is generally detectable in upper respiratory specimens during the acute phase of infection. The lowest concentration of SARS-CoV-2 viral copies this assay can detect is 138 copies/mL. A negative result does not preclude SARS-Cov-2 infection and should not be used as the sole basis for treatment or other patient management decisions. A negative result may occur with  improper specimen collection/handling, submission of specimen other than nasopharyngeal swab, presence of viral mutation(s) within the areas targeted by this assay, and  inadequate number of viral copies(<138 copies/mL). A negative result must be combined with clinical observations, patient history, and epidemiological information. The expected result is Negative.  Fact Sheet for Patients:  BloggerCourse.com  Fact Sheet for Healthcare Providers:  SeriousBroker.it  This test is no t yet approved or cleared by the [place] FDA and  has been authorized for detection and/or diagnosis of SARS-CoV-2 by FDA under an Emergency Use Authorization (EUA). This EUA will remain  in effect (meaning this test can be used) for the duration of the COVID-19 declaration under Section 564(b)(1) of the Act, 21 U.S.C.section 360bbb-3(b)(1), unless the authorization is terminated  or revoked sooner.       Influenza A by PCR NEGATIVE NEGATIVE Final   Influenza B by PCR NEGATIVE NEGATIVE Final    Comment: (NOTE) The Xpert Xpress  SARS-CoV-2/FLU/RSV plus assay is intended as an aid in the diagnosis of influenza from Nasopharyngeal swab specimens and should not be used as a sole basis for treatment. Nasal washings and aspirates are unacceptable for Xpert Xpress SARS-CoV-2/FLU/RSV testing.  Fact Sheet for Patients: BloggerCourse.com  Fact Sheet for Healthcare Providers: SeriousBroker.it  This test is not yet approved or cleared by the [place] FDA and has been authorized for detection and/or diagnosis of SARS-CoV-2 by FDA under an Emergency Use Authorization (EUA). This EUA will remain in effect (meaning this test can be used) for the duration of the COVID-19 declaration under Section 564(b)(1) of the Act, 21 U.S.C. section 360bbb-3(b)(1), unless the authorization is terminated or revoked.  Performed at [facility], [address], [place], [place] [address]      Scheduled Meds: . azithromycin  500 mg Oral Daily   Followed by  . [START ON 01/04/2021] azithromycin  250 mg Oral Daily  . dextromethorphan-guaiFENesin  1 tablet Oral BID  . insulin aspart  0-15 Units Subcutaneous TID WC  . insulin aspart  0-5 Units Subcutaneous QHS  . methylPREDNISolone (SOLU-MEDROL) injection  40 mg Intravenous Q12H  . pantoprazole (PROTONIX) IV  40 mg Intravenous Q24H   Continuous Infusions: . meropenem (MERREM) IV      Procedures/Studies: DG Chest Port 1 View  Result Date: 01/03/2021 CLINICAL DATA:  Short of breath for 3 days, fever and cough EXAM: PORTABLE CHEST 1 VIEW COMPARISON:  10/22/2019 FINDINGS: 2 frontal views of the chest demonstrate a stable cardiac silhouette. No acute airspace disease, effusion, or pneumothorax. Mild peribronchial cuffing. No acute bony abnormalities. IMPRESSION: 1. Mild bronchial wall thickening which could reflect reactive airway disease or viral pneumonitis. No lobar pneumonia. Electronically Signed   By: Sharlet Salina M.D.   On: 01/03/2021  02:37    Catarina Hartshorn, DO  [facility]  If 7PM-7AM, please contact night-coverage [email or web address] Password [email or web address] 01/03/2021, 7:21 AM   LOS: 0 days

## 2021-01-03 NOTE — ED Notes (Signed)
admitting Provider at bedside, pt sitting at edge of bed, has removed his oxygen, vitals updated.

## 2021-01-03 NOTE — Consult Note (Addendum)
Cardiology Consultation:   Patient ID: Adam Marquez MRN: [id number]; DOB: [birth year]  Admit date: 01/03/2021 Date of Consult: 01/03/2021  Primary Care Provider: Jacquelin Hawking, PA-C [facility] Cardiologist: Dina Rich, MD  [facility] Electrophysiologist:  None    Patient Profile:   Adam Marquez is a 65 y.o. male with a hx of persistent atrial fibrillation, chronic diastolic CHF who is being seen today for the evaluation of elavated troponins at the request of Dr. Arbutus Leas.  History of Present Illness:   Adam Marquez with history of persistent atrial fibrillation status post DCCV 10/2019, amiodarone stopped after short course, last office visit 02/2020 had quit taking Eliquis because of cost and transition to Xarelto, chronic diastolic CHF, hypertension, COPD, morbid obesity.  Patient is now admitted with 3-day history of worsening shortness of breath, cough and fever of 101.1.  Lower extremity edema is stable.  We are asked to see because of elevated troponins. He denies any chest pain or edema. Quit smoking 1 week ago. Was wheezing a lot and dyspnea walking to the sink in the room. No rapid heart rates. Applied for disability.   Past Medical History:  Diagnosis Date  . Atrial fibrillation (HCC)    a. s/p DCCV in 10/2019  . COPD (chronic obstructive pulmonary disease) (HCC)   . Eczema   . Hyperlipidemia   . Hypertension   . Type 2 diabetes mellitus (HCC)     Past Surgical History:  Procedure Laterality Date  . ANAL FISSURE REPAIR  1980  . CARDIOVERSION N/A 10/29/2019   Procedure: CARDIOVERSION WITH PROPOFOL;  Surgeon: Laqueta Linden, MD;  Location: [facility];  Service: Cardiovascular;  Laterality: N/A;  . MOUTH SURGERY  1990  . TEE WITHOUT CARDIOVERSION N/A 10/29/2019   Procedure: TRANSESOPHAGEAL ECHOCARDIOGRAM (TEE) WITH PROPOFOL;  Surgeon: Laqueta Linden, MD;  Location: [facility];  Service: Cardiovascular;  Laterality: N/A;     Home  Medications:  Prior to Admission medications   Medication Sig Start Date End Date Taking? Authorizing Provider  acetaminophen (TYLENOL) 500 MG tablet Take 500 mg by mouth every 6 (six) hours as needed.   Yes [provider]  albuterol (PROVENTIL HFA) 108 (90 Base) MCG/ACT inhaler INHALE 2 PUFFS BY MOUTH EVERY 4 HOURS AS NEEDED FOR COUGHING, WHEEZING, OR SHORTNESS OF BREATH 11/10/20  Yes Jacquelin Hawking, PA-C  citalopram (CELEXA) 20 MG tablet Take 1 tablet (20 mg total) by mouth daily. 11/10/20  Yes Jacquelin Hawking, PA-C  fluticasone (FLONASE) 50 MCG/ACT nasal spray Place 2 sprays into both nostrils daily. 11/10/20  Yes Jacquelin Hawking, PA-C  Fluticasone-Salmeterol (ADVAIR DISKUS) 100-50 MCG/DOSE AEPB Inhale 1 puff into the lungs in the morning and at bedtime. 11/10/20  Yes Jacquelin Hawking, PA-C  furosemide (LASIX) 40 MG tablet Take 1 tablet (40 mg total) by mouth daily. 11/10/20  Yes Jacquelin Hawking, PA-C  losartan (COZAAR) 100 MG tablet Take 1 tablet (100 mg total) by mouth daily. 11/10/20  Yes Jacquelin Hawking, PA-C  metoprolol tartrate (LOPRESSOR) 50 MG tablet Take 1 tablet (50 mg total) by mouth 2 (two) times daily. 11/10/20  Yes Jacquelin Hawking, PA-C  Polyvinyl Alcohol-Povidone (CLEAR EYES ALL SEASONS) 5-6 MG/ML SOLN Apply 1 drop to eye daily as needed (for allergies).   Yes [provider]  potassium chloride (KLOR-CON) 10 MEQ tablet Take 1 tablet (10 mEq total) by mouth 2 (two) times daily. 11/10/20  Yes Jacquelin Hawking, PA-C  rivaroxaban (XARELTO) 20 MG TABS tablet Take 1 tablet (20 mg total)  by mouth daily with supper. 11/10/20  Yes Jacquelin Hawking, PA-C  diltiazem (CARDIZEM) 120 MG tablet Take 120 mg by mouth 4 (four) times daily. Patient not taking: No sig reported    [provider]    Inpatient Medications: Scheduled Meds: . budesonide (PULMICORT) nebulizer solution  0.5 mg Nebulization BID  . citalopram  20 mg Oral Daily  .  dextromethorphan-guaiFENesin  1 tablet Oral BID  . furosemide  40 mg Oral Daily  . [START ON 01/04/2021] influenza vac split quadrivalent PF  0.5 mL Intramuscular Tomorrow-1000  . insulin aspart  0-15 Units Subcutaneous TID WC  . insulin aspart  0-5 Units Subcutaneous QHS  . ipratropium  0.5 mg Nebulization Q6H  . levalbuterol  0.63 mg Nebulization Q6H  . methylPREDNISolone (SOLU-MEDROL) injection  60 mg Intravenous Q12H  . pantoprazole (PROTONIX) IV  40 mg Intravenous Q24H  . [START ON 01/04/2021] pneumococcal 23 valent vaccine  0.5 mL Intramuscular Tomorrow-1000  . rivaroxaban  20 mg Oral Q supper   Continuous Infusions: . meropenem (MERREM) IV     PRN Meds: acetaminophen **OR** acetaminophen, acetaminophen, levalbuterol, ondansetron **OR** ondansetron (ZOFRAN) IV  Allergies:    Allergies  Allergen Reactions  . Keflex [Cephalexin] Hives and Nausea And Vomiting    Social History:   Social History   Socioeconomic History  . Marital status: Single    Spouse name: Not on file  . Number of children: Not on file  . Years of education: Not on file  . Highest education level: Not on file  Occupational History  . Not on file  Tobacco Use  . Smoking status: Current Some Day Smoker    Packs/day: 0.25    Years: 42.00    Pack years: 10.50    Types: Cigarettes  . Smokeless tobacco: Never Used  . Tobacco comment: 3 cigs daily - 11-10-2020  Vaping Use  . Vaping Use: Never used  Substance and Sexual Activity  . Alcohol use: Yes    Comment: a 12 pack of beer a week.  . Drug use: Never  . Sexual activity: Not on file  Other Topics Concern  . Not on file  Social History Narrative  . Not on file   Social Determinants of Health   Financial Resource Strain: Not on file  Food Insecurity: Not on file  Transportation Needs: Not on file  Physical Activity: Not on file  Stress: Not on file  Social Connections: Not on file  Intimate Partner Violence: Not on file    Family History:      Family History  Problem Relation Age of Onset  . Lung cancer Mother   . Heart disease Father   . Hypertension Father   . Diabetes Brother   . Cancer Daughter      ROS:  Please see the history of present illness.  Review of Systems  Constitutional: Positive for fever and malaise/fatigue.  HENT: Negative.   Cardiovascular: Positive for dyspnea on exertion.  Respiratory: Positive for cough, shortness of breath and wheezing.   Endocrine: Negative.   Hematologic/Lymphatic: Negative.   Musculoskeletal: Negative.   Gastrointestinal: Negative.   Genitourinary: Negative.   Neurological: Negative.    All other ROS reviewed and negative.     Physical Exam/Data:   Vitals:   01/03/21 0650 01/03/21 0700 01/03/21 0750 01/03/21 0803  BP: 108/75 105/72 125/72   Pulse: (!) 112 (!) 106 90   Resp: (!) 21 (!) 31 20   Temp:   (!) 100.7  F (38.2 C)   TempSrc:   Oral   SpO2: 91% 93% 93% 93%  Weight:   (!) 139.2 kg   Height:   6' (1.829 m)    No intake or output data in the 24 hours ending 01/03/21 1000 Last 3 Weights 01/03/2021 01/03/2021 11/10/2020  Weight (lbs) 306 lb 14.1 oz 310 lb 323 lb  Weight (kg) 139.2 kg 140.615 kg 146.512 kg     Body mass index is 41.62 kg/m.  General:  Obese, in no acute distress HEENT: normal Lymph: no adenopathy Neck: no JVD Endocrine:  No thryomegaly Vascular: No carotid bruits; FA pulses 2+ bilaterally without bruits  Cardiac:  normal S1, S2; RRR; no murmur   Lungs:  Decreased breath sounds with diffuse wheezing Abd: soft, nontender, no hepatomegaly  Ext: trace edema, brawny changes Musculoskeletal:  No deformities, BUE and BLE strength normal and equal Skin: warm and dry  Neuro:  CNs 2-12 intact, no focal abnormalities noted Psych:  Normal affect   EKG:  The EKG was personally reviewed and demonstrates: Sinus tachycardia at 120 bpm with poor R wave progression anteriorly unchanged from prior tracings Telemetry:  Telemetry was personally reviewed  and demonstrates: NSR with freq PAC's/PVC's  Relevant CV Studies: Echo 10/23/2019 IMPRESSIONS     1. Left ventricular ejection fraction, by visual estimation, is 55 to  60%. The left ventricle has normal function. There is mildly increased  left ventricular hypertrophy.   2. Left ventricular diastolic Doppler parameters are indeterminate  pattern of LV diastolic filling.   3. Global right ventricle has low normal systolic function.The right  ventricular size is normal. No increase in right ventricular wall  thickness.   4. Left atrial size was moderately dilated.   5. Right atrial size was not well visualized.   6. The mitral valve was not well visualized. No evidence of mitral valve  regurgitation. No evidence of mitral stenosis.   7. The tricuspid valve is not well visualized. Tricuspid valve  regurgitation was not visualized by color flow Doppler.   8. The aortic valve has an indeterminant number of cusps Aortic valve  regurgitation was not visualized by color flow Doppler. Structurally  normal aortic valve, with no evidence of sclerosis or stenosis.   9. The pulmonic valve was not well visualized. Pulmonic valve  regurgitation is not visualized by color flow Doppler.  10. The inferior vena cava is dilated in size with >50% respiratory  variability, suggesting right atrial pressure of 8 mmHg.    Laboratory Data:  High Sensitivity Troponin:   Recent Labs  Lab 01/03/21 0243 01/03/21 0434  TROPONINIHS 275* 424*     Chemistry Recent Labs  Lab 01/03/21 0243  NA 132*  K 4.1  CL 93*  CO2 24  GLUCOSE 120*  BUN 10  CREATININE 1.10  CALCIUM 9.4  GFRNONAA >60  ANIONGAP 15    Recent Labs  Lab 01/03/21 0243  PROT 8.0  ALBUMIN 4.2  AST 28  ALT 16  ALKPHOS 74  BILITOT 1.2   Hematology Recent Labs  Lab 01/03/21 0243  WBC 4.9  RBC 5.19  HGB 17.6*  HCT 53.5*  MCV 103.1*  MCH 33.9  MCHC 32.9  RDW 14.3  PLT 107*   BNP Recent Labs  Lab 01/03/21 0243   BNP 456.0*    DDimer  Recent Labs  Lab 01/03/21 0243  DDIMER 0.42     Radiology/Studies:  DG Chest Port 1 View  Result Date: 01/03/2021 CLINICAL DATA:  Short of breath for 3 days, fever and cough EXAM: PORTABLE CHEST 1 VIEW COMPARISON:  10/22/2019 FINDINGS: 2 frontal views of the chest demonstrate a stable cardiac silhouette. No acute airspace disease, effusion, or pneumothorax. Mild peribronchial cuffing. No acute bony abnormalities. IMPRESSION: 1. Mild bronchial wall thickening which could reflect reactive airway disease or viral pneumonitis. No lobar pneumonia. Electronically Signed   By: Randa Ngo M.D.   On: 01/03/2021 02:37     Assessment and Plan:   Acute respiratory failure secondary to COPD exacerbation  Elevated troponins 424 most likely secondary to demand ischemia.  No chest pain or EKG changes.Trend troponins. Has never had a stress test. Can consider as outpatient.   Paroxysmal atrial fibrillation on metoprolol currently being held because of soft blood pressures and Xarelto.  Previous cardioversion 10/2019. Currently in NSR with freq PAC's some PVC's. BP back up, would restart metoprolol   Chronic diastolic CHF Echo 34/19/3790 LVEF 55 to 60%.  For repeat today  Hypertension BP stable-takes cozaar 100 mg daily, metoprolol 50 mg bid, lasix 40 mg daily  COPD quit smoking 1 week ago            CHA2DS2-VASc Score = 2  This indicates a 2.2% annual risk of stroke. The patient's score is based upon: CHF History: Yes HTN History: Yes Diabetes History: No Stroke History: No Vascular Disease History: No Age Score: 0 Gender Score: 0         For questions or updates, please contact [place] Please consult [email or web address] for contact info under    Signed, Ermalinda Barrios, PA-C  01/03/2021 10:00 AM   Attending note:  Patient seen and examined.  I reviewed his records and discussed the case with Ms. Vita Barley, I agree with her above findings.  Mr.  Inniss presents to the hospital with acute hypoxic respiratory failure in the setting of suspected COPD exacerbation and viral bronchitis.  SARS coronavirus 2 test is negative.  Cardiac history includes persistent atrial fibrillation and chronic diastolic heart failure.  He has had no chest discomfort or palpitations, currently in sinus rhythm.  Temperature 100.7 degrees, heart rate 90 in sinus rhythm, blood pressure 125/72.  Lungs with decreased breath sounds and expiratory wheezes.  Cardiac exam with RRR no gallop.  Pertinent lab work includes potassium 4.1, creatinine 1.1, BNP 456, high-sensitivity troponin I of 275 and 424, hemoglobin 17.6, platelets 107.  I personally reviewed his ECG today which shows sinus tachycardia with leftward axis, poor R wave progression rule out old anterior infarct pattern.  Chest x-ray reports bronchial wall thickening without infiltrates.  Low-level elevation in high-sensitivity troponin I most consistent with demand ischemia, no active chest pain and in the setting of acute hypoxic respiratory failure with COPD and likely viral bronchitis.  Agree with echocardiogram to assess LVEF and rule out wall motion abnormalities.  If reassuring, no further inpatient cardiac testing is planned, he can be seen as an outpatient and considered for a follow-up Myoview.  Satira Sark, M.D., F.A.C.C.

## 2021-01-03 NOTE — Progress Notes (Signed)
*  PRELIMINARY RESULTS* Echocardiogram 2D Echocardiogram has been performed.  Stacey Drain 01/03/2021, 11:33 AM

## 2021-01-03 NOTE — ED Triage Notes (Signed)
Pt with sob x 3 days with fever and cough.

## 2021-01-03 NOTE — ED Notes (Signed)
First Covid POC should have been entered as negative instead of positive.

## 2021-01-03 NOTE — H&P (Addendum)
History and Physical  Adam Marquez GEZ:[id number] DOB: 05/10/1956 DOA: 01/03/2021  Referring physician: Devoria Albe, MD PCP: Jacquelin Hawking, PA-C  Patient coming from: Home  Chief Complaint: Shortness of breath  HPI: Adam Marquez is a 65 y.o. male with medical history significant for hypertension, CHF, A. fib with RVR, COPD (not on home oxygen), tobacco use and morbid obesity who presents to the emergency department accompanied by brother due to 2-day onset of increasing shortness of breath.  Shortness of breath worsens with lying down in bed and ambulation, this was associated with cough with production of clear sputum, fever of 101F and leg swelling bilaterally.  Patient used to home inhaler with minimal relief, he states that he quit smoking about 6 months ago and that he took [place] Covid vaccine x2.  He denies abdominal pain, nausea, vomiting or having any sick contact.  ED Course:  In the emergency department, he was noted to be febrile with a temperature of 100.8F, he was tachypneic and tachycardic.  BP was 162/101.  Work-up in the ED showed macrocytic anemia, thrombocytopenia,, troponin I 275 >424, BNP 456, respiratory panel for influenza A, B and SARS coronavirus 2 was negative (of note, this was initially mistakenly recorded to be positive). Chest x-ray showed mild bronchial wall thickening which could reflect reactive airway disease or viral pneumonitis. No lobar pneumonia. Several rounds of breathing treatment was provided, Decadron was given.  Hospitalist was asked to admit patient for further evaluation and management.  Review of Systems: Constitutional: Positive for fever.  Negative for chills  HENT: Negative for ear pain and sore throat.   Eyes: Negative for pain and visual disturbance.  Respiratory: Positive for shortness of breath and  cough Cardiovascular: Negative for chest pain and palpitations.  Gastrointestinal: Negative for abdominal pain and vomiting.   Endocrine: Negative for polyphagia and polyuria.  Genitourinary: Negative for decreased urine volume, dysuria, enuresis Musculoskeletal: Positive for leg swelling.  Negative for arthralgias and back pain.  Skin: Negative for color change and rash.  Allergic/Immunologic: Negative for immunocompromised state.  Neurological: Negative for tremors, syncope, speech difficulty, weakness, light-headedness and headaches.  Hematological: Does not bruise/bleed easily.  All other systems reviewed and are negative  Past Medical History:  Diagnosis Date  . Atrial fibrillation (HCC)    a. s/p DCCV in 10/2019  . COPD (chronic obstructive pulmonary disease) (HCC)   . Diabetes mellitus without complication (HCC)   . Eczema   . Hyperlipidemia   . Hypertension    Past Surgical History:  Procedure Laterality Date  . ANAL FISSURE REPAIR  1980  . CARDIOVERSION N/A 10/29/2019   Procedure: CARDIOVERSION WITH PROPOFOL;  Surgeon: Laqueta Linden, MD;  Location: [facility];  Service: Cardiovascular;  Laterality: N/A;  . MOUTH SURGERY  1990  . TEE WITHOUT CARDIOVERSION N/A 10/29/2019   Procedure: TRANSESOPHAGEAL ECHOCARDIOGRAM (TEE) WITH PROPOFOL;  Surgeon: Laqueta Linden, MD;  Location: [facility];  Service: Cardiovascular;  Laterality: N/A;    Social History:  reports that he has been smoking cigarettes. He has a 10.50 pack-year smoking history. He has never used smokeless tobacco. He reports current alcohol use. He reports that he does not use drugs.   Allergies  Allergen Reactions  . Keflex [Cephalexin] Hives and Nausea And Vomiting    Family History  Problem Relation Age of Onset  . Lung cancer Mother   . Heart disease Father   . Hypertension Father   . Diabetes Brother   . Cancer Daughter  Prior to Admission medications   Medication Sig Start Date End Date Taking? Authorizing Provider  acetaminophen (TYLENOL) 500 MG tablet Take 500 mg by mouth every 6 (six) hours as needed.     [provider]  albuterol (PROVENTIL HFA) 108 (90 Base) MCG/ACT inhaler INHALE 2 PUFFS BY MOUTH EVERY 4 HOURS AS NEEDED FOR COUGHING, WHEEZING, OR SHORTNESS OF BREATH 11/10/20   Jacquelin Hawking, PA-C  citalopram (CELEXA) 20 MG tablet Take 1 tablet (20 mg total) by mouth daily. 11/10/20   Jacquelin Hawking, PA-C  diltiazem (CARDIZEM) 120 MG tablet Take 120 mg by mouth 4 (four) times daily. Patient not taking: Reported on 11/10/2020    [provider]  fluticasone (FLONASE) 50 MCG/ACT nasal spray Place 2 sprays into both nostrils daily. 11/10/20   Jacquelin Hawking, PA-C  Fluticasone-Salmeterol (ADVAIR DISKUS) 100-50 MCG/DOSE AEPB Inhale 1 puff into the lungs in the morning and at bedtime. 11/10/20   Jacquelin Hawking, PA-C  furosemide (LASIX) 40 MG tablet Take 1 tablet (40 mg total) by mouth daily. 11/10/20   Jacquelin Hawking, PA-C  losartan (COZAAR) 100 MG tablet Take 1 tablet (100 mg total) by mouth daily. 11/10/20   Jacquelin Hawking, PA-C  metoprolol tartrate (LOPRESSOR) 50 MG tablet Take 1 tablet (50 mg total) by mouth 2 (two) times daily. 11/10/20   Jacquelin Hawking, PA-C  Polyvinyl Alcohol-Povidone (CLEAR EYES ALL SEASONS) 5-6 MG/ML SOLN Apply 1 drop to eye daily as needed (for allergies).    [provider]  potassium chloride (KLOR-CON) 10 MEQ tablet Take 1 tablet (10 mEq total) by mouth 2 (two) times daily. 11/10/20   Jacquelin Hawking, PA-C  rivaroxaban (XARELTO) 20 MG TABS tablet Take 1 tablet (20 mg total) by mouth daily with supper. 11/10/20   Jacquelin Hawking, PA-C    Physical Exam: BP 108/75 (BP Location: Left Arm)   Pulse (!) 112   Temp (!) 100.4 F (38 C) (Oral)   Resp (!) 21   Ht 6' (1.829 m)   Wt (!) 140.6 kg   SpO2 91%   BMI 42.04 kg/m   . General: 65 y.o. year-old male obese in no acute distress.  Alert and oriented x3. [name] Kitchen HEENT: NCAT, EOMI . Neck: Supple, trachea medial . Cardiovascular: Tachycardia.  Regular rate and rhythm with no rubs or  gallops.  No thyromegaly or JVD noted.  2/4 pulses in all 4 extremities. [name] Kitchen Respiratory: Tachypnea.  Decreased breath sounds with mild scattered wheezing.   . Abdomen: Soft, nontender, obese with normal bowel sounds x4 quadrants. . Muskuloskeletal: Trace edema bilaterally in legs.  No no cyanosis or clubbing  . Neuro: CN II-XII intact, strength, sensation, reflexes . Skin: No ulcerative lesions noted or rashes . Psychiatry: Judgement and insight appear normal. Mood is appropriate for condition and setting          Labs on Admission:  Basic Metabolic Panel: Recent Labs  Lab 01/03/21 0243  NA 132*  K 4.1  CL 93*  CO2 24  GLUCOSE 120*  BUN 10  CREATININE 1.10  CALCIUM 9.4   Liver Function Tests: Recent Labs  Lab 01/03/21 0243  AST 28  ALT 16  ALKPHOS 74  BILITOT 1.2  PROT 8.0  ALBUMIN 4.2   No results for input(s): LIPASE, AMYLASE in the last 168 hours. No results for input(s): AMMONIA in the last 168 hours. CBC: Recent Labs  Lab 01/03/21 0243  WBC 4.9  NEUTROABS 3.9  HGB 17.6*  HCT 53.5*  MCV 103.1*  PLT 107*   Cardiac Enzymes: No results for input(s): CKTOTAL, CKMB, CKMBINDEX, TROPONINI in the last 168 hours.  BNP (last 3 results) Recent Labs    01/03/21 0243  BNP 456.0*    ProBNP (last 3 results) No results for input(s): PROBNP in the last 8760 hours.  CBG: No results for input(s): GLUCAP in the last 168 hours.  Radiological Exams on Admission: DG Chest Port 1 View  Result Date: 01/03/2021 CLINICAL DATA:  Short of breath for 3 days, fever and cough EXAM: PORTABLE CHEST 1 VIEW COMPARISON:  10/22/2019 FINDINGS: 2 frontal views of the chest demonstrate a stable cardiac silhouette. No acute airspace disease, effusion, or pneumothorax. Mild peribronchial cuffing. No acute bony abnormalities. IMPRESSION: 1. Mild bronchial wall thickening which could reflect reactive airway disease or viral pneumonitis. No lobar pneumonia. Electronically Signed   By: Sharlet Salina M.D.   On: 01/03/2021 02:37    EKG: I independently viewed the EKG done and my findings are as followed: Sinus tachycardia at rate of 112 bpm  Assessment/Plan Present on Admission: . Acute respiratory distress . COPD (chronic obstructive pulmonary disease) (HCC) . Hypertension  Principal Problem:   Acute respiratory distress Active Problems:   Hypertension   COPD (chronic obstructive pulmonary disease) (HCC)   COPD with acute bronchitis (HCC)   Elevated brain natriuretic peptide (BNP) level   Elevated troponin   Macrocytic anemia   Viral pneumonitis  Acute respiratory distress possibly secondary to acute bronchitis due to viral pneumonitis superimposed with moderate COPD exacerbation Continue Tylenol 650 mg p.o. every 6 hours as needed for fever Continue Xopenex, Mucinex, Solu-Medrol, azithromycin. Continue Protonix to prevent steroid-induced ulcer Continue ISS and hypoglycemia protocol due to possible induced hypoglycemia secondary to steroid Continue incentive spirometry and flutter valve  Elevated BNP r/o acute on chronic acute CHF BNP 456 Continue total input/output, daily weights and fluid restriction Continue Cardiac diet  Echocardiogram in October 2020 showed LVEF of 55 to 60%.  This will be repeated in the morning   Elevated troponin  Troponin I 275 > 424 This may be due to type II demand ischemia, however it would be reasonable to rule out ACS Patient denies any chest pain EKG personally reviewed showed sinus tachycardia at rate of 112 bpm Continue to trend troponin Continue home Xarelto Cardiology will be consulted  Macrocytic anemia Vitamin B12 and folate level will be checked  Essential hypertension BP meds will be held at this time due to soft BP  History of atrial fibrillation EKG shows sinus tachycardia at rate of 121 bpm Home Lopressor will be held at this time due to soft BP Continue Xarelto  Morbid obesity (BMI of 42.04) Patient will be  counseled on diet and lifestyle modification when more stable.  DVT prophylaxis: Xarelto  Code Status: Full code  Family Communication: Brother at bedside (all questions answered to satisfaction)  Disposition Plan:  Patient is from:                        home Anticipated DC to:                   SNF or family members home Anticipated DC date:               2-3 days Anticipated DC barriers:           Patient is unstable to be discharged at this time due to acute respiratory distress and pending CHF  and ACS work-up  Consults called: Cardiology  Admission status: Observation   Bernadette Hoit MD [facility]  01/03/2021, 7:05 AM

## 2021-01-03 NOTE — Progress Notes (Signed)
Dr. Diona Browner reviewed echo and shows normal LVEF. Troponins are felt to be demand ischemia. Patient with no cardiac symptoms. Will arrange outpatient f/u in 2-3 weeks.

## 2021-01-03 NOTE — ED Provider Notes (Signed)
[facility] Provider Note   CSN: [id number] Arrival date & time: 01/03/21  0159   Time seen 2:01 AM  History Chief Complaint  Patient presents with  . Shortness of Breath    Adam Marquez is a 65 y.o. male.  HPI   Patient reports 2 to 3 days of cough coughing up clear fluid, shortness of breath, fever up to 101 today and swelling of his legs.  He is not on oxygen at home.  EMS reports his pulse ox on room air was 91%.  They did not give any treatments in route.  Patient denies chest pain but states his chest feels tight.  He has used his inhalers with minimal relief.  He states he quit smoking 6 months ago.  He states he has a history of congestive heart failure last year.  He has not been exposed to anybody else who is sick.  Patient states he has taken the [place] vaccine x2  PCP Soyla Dryer, PA-C   Past Medical History:  Diagnosis Date  . Atrial fibrillation ([place])    a. s/p DCCV in 10/2019  . COPD (chronic obstructive pulmonary disease) ([place])   . Diabetes mellitus without complication ([place])   . Eczema   . Hyperlipidemia   . Hypertension     Patient Active Problem List   Diagnosis Date Noted  . Acute diastolic CHF (congestive heart failure) ([place]) 10/24/2019  . Bilateral lower extremity edema   . Lymphedema 10/23/2019  . Acute CHF (congestive heart failure) ([place]) 10/23/2019  . Obesity, Class III, BMI 40-49.9 (morbid obesity) ([place]) 10/23/2019  . Atrial fibrillation with RVR ([place]) 10/22/2019  . Hypertension   . COPD (chronic obstructive pulmonary disease) ([place])   . Tobacco use     Past Surgical History:  Procedure Laterality Date  . ANAL FISSURE REPAIR  1980  . CARDIOVERSION N/A 10/29/2019   Procedure: CARDIOVERSION WITH PROPOFOL;  Surgeon: Herminio Commons, MD;  Location: [facility];  Service: Cardiovascular;  Laterality: N/A;  . [place]  . TEE WITHOUT CARDIOVERSION N/A 10/29/2019   Procedure: TRANSESOPHAGEAL  ECHOCARDIOGRAM (TEE) WITH PROPOFOL;  Surgeon: Herminio Commons, MD;  Location: [facility];  Service: Cardiovascular;  Laterality: N/A;       Family History  Problem Relation Age of Onset  . Lung cancer Mother   . Heart disease Father   . Hypertension Father   . Diabetes Brother   . Cancer Daughter     Social History   Tobacco Use  . Smoking status: Current Some Day Smoker    Packs/day: 0.25    Years: 42.00    Pack years: 10.50    Types: Cigarettes  . Smokeless tobacco: Never Used  . Tobacco comment: 3 cigs daily - 11-10-2020  Vaping Use  . Vaping Use: Never used  Substance Use Topics  . Alcohol use: Yes    Comment: a 12 pack of beer a week.  . Drug use: Never    Home Medications Prior to Admission medications   Medication Sig Start Date End Date Taking? Authorizing Provider  acetaminophen (TYLENOL) 500 MG tablet Take 500 mg by mouth every 6 (six) hours as needed.    [provider]  albuterol (PROVENTIL HFA) 108 (90 Base) MCG/ACT inhaler INHALE 2 PUFFS BY MOUTH EVERY 4 HOURS AS NEEDED FOR COUGHING, WHEEZING, OR SHORTNESS OF BREATH 11/10/20   Soyla Dryer, PA-C  citalopram (CELEXA) 20 MG tablet Take 1 tablet (20 mg total)  by mouth daily. 11/10/20   Soyla Dryer, PA-C  diltiazem (CARDIZEM) 120 MG tablet Take 120 mg by mouth 4 (four) times daily. Patient not taking: Reported on 11/10/2020    [provider]  fluticasone (FLONASE) 50 MCG/ACT nasal spray Place 2 sprays into both nostrils daily. 11/10/20   Soyla Dryer, PA-C  Fluticasone-Salmeterol (ADVAIR DISKUS) 100-50 MCG/DOSE AEPB Inhale 1 puff into the lungs in the morning and at bedtime. 11/10/20   Soyla Dryer, PA-C  furosemide (LASIX) 40 MG tablet Take 1 tablet (40 mg total) by mouth daily. 11/10/20   Soyla Dryer, PA-C  losartan (COZAAR) 100 MG tablet Take 1 tablet (100 mg total) by mouth daily. 11/10/20   Soyla Dryer, PA-C  metoprolol tartrate (LOPRESSOR) 50 MG tablet Take 1  tablet (50 mg total) by mouth 2 (two) times daily. 11/10/20   Soyla Dryer, PA-C  Polyvinyl Alcohol-Povidone (CLEAR EYES ALL SEASONS) 5-6 MG/ML SOLN Apply 1 drop to eye daily as needed (for allergies).    [provider]  potassium chloride (KLOR-CON) 10 MEQ tablet Take 1 tablet (10 mEq total) by mouth 2 (two) times daily. 11/10/20   Soyla Dryer, PA-C  rivaroxaban (XARELTO) 20 MG TABS tablet Take 1 tablet (20 mg total) by mouth daily with supper. 11/10/20   Soyla Dryer, PA-C    Allergies    Keflex [cephalexin]  Review of Systems   Review of Systems  All other systems reviewed and are negative.   Physical Exam Updated Vital Signs BP 112/74   Pulse (!) 124   Temp (!) 100.4 F (38 C) (Oral)   Resp 20   Ht 6' (1.829 m)   Wt (!) 140.6 kg   SpO2 98%   BMI 42.04 kg/m   Physical Exam Vitals and nursing note reviewed.  Constitutional:      General: He is in acute distress.     Appearance: Normal appearance. He is obese. He is not ill-appearing or toxic-appearing.  HENT:     Right Ear: External ear normal.     Left Ear: External ear normal.  Eyes:     Extraocular Movements: Extraocular movements intact.     Conjunctiva/sclera: Conjunctivae normal.     Pupils: Pupils are equal, round, and reactive to light.  Cardiovascular:     Rate and Rhythm: Regular rhythm. Tachycardia present.     Pulses: Normal pulses.     Heart sounds: Normal heart sounds. No murmur heard.   Pulmonary:     Effort: Tachypnea, accessory muscle usage, prolonged expiration and respiratory distress present.     Breath sounds: Decreased breath sounds and wheezing present.     Comments: Has difficulty speaking even short sentences Abdominal:     General: There is distension.  Musculoskeletal:     Cervical back: Normal range of motion.     Comments: Trace edema noted on his legs  Skin:    General: Skin is warm and dry.  Neurological:     General: No focal deficit present.     Mental  Status: He is alert and oriented to person, place, and time.     Cranial Nerves: No cranial nerve deficit.  Psychiatric:        Mood and Affect: Mood normal.        Behavior: Behavior normal.        Thought Content: Thought content normal.     ED Results / Procedures / Treatments   Labs (all labs ordered are listed, but only abnormal results are  displayed) Results for orders placed or performed during the hospital encounter of 01/03/21  Resp Panel by RT-PCR (Flu A&B, Covid) Nasopharyngeal Swab   Specimen: Nasopharyngeal Swab; Nasopharyngeal(NP) swabs in vial transport medium  Result Value Ref Range   SARS Coronavirus 2 by RT PCR NEGATIVE NEGATIVE   Influenza A by PCR NEGATIVE NEGATIVE   Influenza B by PCR NEGATIVE NEGATIVE  Comprehensive metabolic panel  Result Value Ref Range   Sodium 132 (L) 135 - 145 mmol/L   Potassium 4.1 3.5 - 5.1 mmol/L   Chloride 93 (L) 98 - 111 mmol/L   CO2 24 22 - 32 mmol/L   Glucose, Bld 120 (H) 70 - 99 mg/dL   BUN 10 8 - 23 mg/dL   Creatinine, Ser 1.10 0.61 - 1.24 mg/dL   Calcium 9.4 8.9 - 10.3 mg/dL   Total Protein 8.0 6.5 - 8.1 g/dL   Albumin 4.2 3.5 - 5.0 g/dL   AST 28 15 - 41 U/L   ALT 16 0 - 44 U/L   Alkaline Phosphatase 74 38 - 126 U/L   Total Bilirubin 1.2 0.3 - 1.2 mg/dL   GFR, Estimated >60 >60 mL/min   Anion gap 15 5 - 15  CBC with Differential  Result Value Ref Range   WBC 4.9 4.0 - 10.5 K/uL   RBC 5.19 4.22 - 5.81 MIL/uL   Hemoglobin 17.6 (H) 13.0 - 17.0 g/dL   HCT 53.5 (H) 39.0 - 52.0 %   MCV 103.1 (H) 80.0 - 100.0 fL   MCH 33.9 26.0 - 34.0 pg   MCHC 32.9 30.0 - 36.0 g/dL   RDW 14.3 11.5 - 15.5 %   Platelets 107 (L) 150 - 400 K/uL   nRBC 0.0 0.0 - 0.2 %   Neutrophils Relative % 78 %   Neutro Abs 3.9 1.7 - 7.7 K/uL   Lymphocytes Relative 12 %   Lymphs Abs 0.6 (L) 0.7 - 4.0 K/uL   Monocytes Relative 8 %   Monocytes Absolute 0.4 0.1 - 1.0 K/uL   Eosinophils Relative 1 %   Eosinophils Absolute 0.0 0.0 - 0.5 K/uL   Basophils  Relative 1 %   Basophils Absolute 0.0 0.0 - 0.1 K/uL   Immature Granulocytes 0 %   Abs Immature Granulocytes 0.01 0.00 - 0.07 K/uL  Brain natriuretic peptide  Result Value Ref Range   B Natriuretic Peptide 456.0 (H) 0.0 - 100.0 pg/mL  Lactic acid, plasma  Result Value Ref Range   Lactic Acid, Venous 1.8 0.5 - 1.9 mmol/L  Lactic acid, plasma  Result Value Ref Range   Lactic Acid, Venous 1.6 0.5 - 1.9 mmol/L  D-dimer, quantitative  Result Value Ref Range   D-Dimer, Quant 0.42 0.00 - 0.50 ug/mL-FEU  Procalcitonin  Result Value Ref Range   Procalcitonin 0.19 ng/mL  Lactate dehydrogenase  Result Value Ref Range   LDH 170 98 - 192 U/L  Ferritin  Result Value Ref Range   Ferritin 171 24 - 336 ng/mL  Triglycerides  Result Value Ref Range   Triglycerides 96 <150 mg/dL  Fibrinogen  Result Value Ref Range   Fibrinogen 577 (H) 210 - 475 mg/dL  C-reactive protein  Result Value Ref Range   CRP 9.3 (H) <1.0 mg/dL  POC SARS Coronavirus 2 Ag-ED - Nasal Swab (BD Veritor Kit)  Result Value Ref Range   SARS Coronavirus 2 Ag POSITIVE (A) NEGATIVE  POC SARS Coronavirus 2 Ag-ED -  Result Value Ref Range   SARS Coronavirus  2 Ag NEGATIVE NEGATIVE  Troponin I (High Sensitivity)  Result Value Ref Range   Troponin I (High Sensitivity) 275 (HH) <18 ng/L  Troponin I (High Sensitivity)  Result Value Ref Range   Troponin I (High Sensitivity) 424 (HH) <18 ng/L   Laboratory interpretation all normal except initial elevated troponin with significant rise, mild hyponatremia, elevated hemoglobin consistent with history of COPD or dehydration, mild elevation of BNP of uncertain significance    EKG EKG Interpretation  Date/Time:  Tuesday January 03 2021 02:09:41 EST Ventricular Rate:  121 PR Interval:    QRS Duration: 85 QT Interval:  328 QTC Calculation: 466 R Axis:   -43 Text Interpretation: Sinus tachycardia Left axis deviation Anterolateral infarct, old Baseline wander in lead(s) V3 Since  last tracing rate faster 29 Oct 2019 Confirmed by Rolland Porter [phone]) on 01/03/2021 2:15:35 AM   Radiology DG Chest Port 1 View  Result Date: 01/03/2021 CLINICAL DATA:  Short of breath for 3 days, fever and cough EXAM: PORTABLE CHEST 1 VIEW COMPARISON:  10/22/2019 FINDINGS: 2 frontal views of the chest demonstrate a stable cardiac silhouette. No acute airspace disease, effusion, or pneumothorax. Mild peribronchial cuffing. No acute bony abnormalities. IMPRESSION: 1. Mild bronchial wall thickening which could reflect reactive airway disease or viral pneumonitis. No lobar pneumonia. Electronically Signed   By: Randa Ngo M.D.   On: 01/03/2021 02:37    Procedures .Critical Care Performed by: Rolland Porter, MD Authorized by: Rolland Porter, MD   Critical care provider statement:    Critical care time (minutes):  40   Critical care was necessary to treat or prevent imminent or life-threatening deterioration of the following conditions:  Respiratory failure   Critical care was time spent personally by me on the following activities:  Examination of patient, obtaining history from patient or surrogate, ordering and review of laboratory studies, pulse oximetry, ordering and review of radiographic studies and re-evaluation of patient's condition   Care discussed with: admitting provider     (including critical care time)  Medications Ordered in ED Medications  aerochamber Z-Stat Plus/medium 1 each (1 each Other Given 01/03/21 0317)  albuterol (VENTOLIN HFA) 108 (90 Base) MCG/ACT inhaler 6 puff (6 puffs Inhalation Given 01/03/21 0258)  dexamethasone (DECADRON) injection 10 mg (10 mg Intravenous Given 01/03/21 0247)  albuterol (VENTOLIN HFA) 108 (90 Base) MCG/ACT inhaler 6 puff (6 puffs Inhalation Given 01/03/21 0317)  albuterol (PROVENTIL,VENTOLIN) solution continuous neb (10 mg/hr Nebulization Given 01/03/21 0421)    ED Course  I have reviewed the triage vital signs and the nursing notes.  Pertinent labs &  imaging results that were available during my care of the patient were reviewed by me and considered in my medical decision making (see chart for details).    MDM Rules/Calculators/A&P                         Patient was given albuterol by inhaler 6 puffs.  He was given Decadron IV.  Laboratory testing was done.  Covid testing was done.  At this point he appears to be a COPD exacerbation, may be a component of congestive heart failure.  2:50 AM nurses state that the point-of-care Covid test was actually negative not positive.  The next tier test was sent.  Recheck at 2:55 AM patient now is able to speak in full sentences.  He states he still having trouble breathing.  When I listen to him he has some improved air movement but  still has some diffuse wheezing.  He was given a repeat albuterol 6 puffs by inhaler.  4:05 AM patient has improved air movement and still has some scattered wheezing but much improved.  His Covid test has come back negative.  He was given albuterol by nebulizer.  His initial troponin was elevated, his delta troponin is pending.  Patient's delta troponin has resulted and it is increasing.  Patient's EKG did not show any acute changes and he really did not describe chest pain he just felt like he could not take a big deep breath.  I will talk to the hospitalist about admission.  Recheck after his continuous nebulizer.  Patient has rare wheeze.  He states his breathing is much better.  We discussed his elevated troponins and he understands the need for admission.  6:00 AM Dr. Josephine Cables, hospitalist will come see patient for admission.  Final Clinical Impression(s) / ED Diagnoses Final diagnoses:  COPD exacerbation ([place])  Elevated troponin    Rx / DC Orders  Plan admission  Rolland Porter, MD, Barbette Or, MD 01/03/21 [phone]

## 2021-01-03 NOTE — ED Notes (Signed)
ED Provider at bedside. 

## 2021-01-03 NOTE — ED Notes (Signed)
Confirmed with Ginger, RN that pt Rapid COVID was NEGATIVE

## 2021-01-04 LAB — COMPREHENSIVE METABOLIC PANEL
ALT: 16 U/L (ref 0–44)
AST: 29 U/L (ref 15–41)
Albumin: 3.7 g/dL (ref 3.5–5.0)
Alkaline Phosphatase: 60 U/L (ref 38–126)
Anion gap: 12 (ref 5–15)
BUN: 25 mg/dL — ABNORMAL HIGH (ref 8–23)
CO2: 27 mmol/L (ref 22–32)
Calcium: 9.1 mg/dL (ref 8.9–10.3)
Chloride: 93 mmol/L — ABNORMAL LOW (ref 98–111)
Creatinine, Ser: 1.08 mg/dL (ref 0.61–1.24)
GFR, Estimated: >60 mL/min (ref >60)
Glucose, Bld: 158 mg/dL — ABNORMAL HIGH (ref 70–99)
Potassium: 4.3 mmol/L (ref 3.5–5.1)
Sodium: 132 mmol/L — ABNORMAL LOW (ref 135–145)
Total Bilirubin: 0.8 mg/dL (ref 0.3–1.2)
Total Protein: 7.7 g/dL (ref 6.5–8.1)

## 2021-01-04 LAB — PHOSPHORUS: Phosphorus: 3.9 mg/dL (ref 2.5–4.6)

## 2021-01-04 LAB — GLUCOSE, CAPILLARY
Glucose-Capillary: 160 mg/dL — ABNORMAL HIGH (ref 70–99)
Glucose-Capillary: 240 mg/dL — ABNORMAL HIGH (ref 70–99)
Glucose-Capillary: 201 mg/dL — ABNORMAL HIGH (ref 70–99)
Glucose-Capillary: 180 mg/dL — ABNORMAL HIGH (ref 70–99)

## 2021-01-04 LAB — PROTIME-INR
INR: 1.9 — ABNORMAL HIGH (ref 0.8–1.2)
Prothrombin Time: 21.2 seconds — ABNORMAL HIGH (ref 11.4–15.2)

## 2021-01-04 LAB — CBC
HCT: 49.8 % (ref 39.0–52.0)
Hemoglobin: 16.4 g/dL (ref 13.0–17.0)
MCH: 34 pg (ref 26.0–34.0)
MCHC: 32.9 g/dL (ref 30.0–36.0)
MCV: 103.1 fL — ABNORMAL HIGH (ref 80.0–100.0)
Platelets: 114 10*3/uL — ABNORMAL LOW (ref 150–400)
RBC: 4.83 MIL/uL (ref 4.22–5.81)
RDW: 14.4 % (ref 11.5–15.5)
WBC: 7.3 10*3/uL (ref 4.0–10.5)
nRBC: 0 % (ref 0.0–0.2)

## 2021-01-04 LAB — APTT: aPTT: 41 seconds — ABNORMAL HIGH (ref 24–36)

## 2021-01-04 LAB — MAGNESIUM: Magnesium: 2.1 mg/dL (ref 1.7–2.4)

## 2021-01-04 MED ORDER — AZITHROMYCIN 250 MG PO TABS: 250.0000 mg | ORAL_TABLET | Freq: Every day | ORAL | Status: DC

## 2021-01-04 MED ORDER — PANTOPRAZOLE SODIUM 40 MG PO TBEC
40.0000 mg | DELAYED_RELEASE_TABLET | Freq: Every day | ORAL | Status: DC
  Administered 2021-01-05 – 2021-01-08 (×4): 40 mg via ORAL
  Filled 2021-01-04 (×5): qty 1

## 2021-01-04 NOTE — TOC Initial Note (Signed)
Transition of Care [facility]) - Initial/Assessment Note    Patient Details  Name: Adam Marquez MRN: [id number] Date of Birth: [birth year]  Transition of Care [facility]) CM/SW Contact:    Iona Beard, [place] Phone Number: 01/04/2021, 3:43 PM  Clinical Narrative:                 Pt admitted due to respiratory distress. CSW met with pt in room to assess. Pt lives with his brother. Pt states that he is able to complete ADLs independently 90% of the time, when asked about other 10% pt states "I just go with it". Pt does not drive and takes a cab when transportation is needed. Pt has not had [place] services. Pt uses a shower chair. Pt states that he gets his medications through [facility]. Pt states his medications take about 4-5 days to arrive via mail once the prescription is sent in. Pt states that he hopes his early retirement will begin soon and he can pick up medications at the pharmacy. TOC to follow.   Expected Discharge Plan: Home/Self Care Barriers to Discharge: Continued Medical Work up   Patient Goals and CMS Choice Patient states their goals for this hospitalization and ongoing recovery are:: Return home   Choice offered to / list presented to : NA  Expected Discharge Plan and Services Expected Discharge Plan: Home/Self Care In-house Referral: NA Discharge Planning Services: NA Post Acute Care Choice: NA Living arrangements for the past 2 months: Single Family Home                 DME Arranged: N/A DME Agency: NA       HH Arranged: NA HH Agency: NA        Prior Living Arrangements/Services Living arrangements for the past 2 months: Single Family Home Lives with:: Siblings Patient language and need for interpreter reviewed:: Yes Do you feel safe going back to the place where you live?: Yes      Need for Family Participation in Patient Care: No (Comment) Care giver support system in place?: Yes (comment) Current home services: DME Sales executive chair) Criminal Activity/Legal  Involvement Pertinent to Current Situation/Hospitalization: No - Comment as needed  Activities of Daily Living Home Assistive Devices/Equipment: None ADL Screening (condition at time of admission) Patient's cognitive ability adequate to safely complete daily activities?: Yes Is the patient deaf or have difficulty hearing?: No Does the patient have difficulty seeing, even when wearing glasses/contacts?: No Does the patient have difficulty concentrating, remembering, or making decisions?: No Patient able to express need for assistance with ADLs?: Yes Does the patient have difficulty dressing or bathing?: No Independently performs ADLs?: Yes (appropriate for developmental age) Communication: Independent Dressing (OT): Independent Grooming: Independent Feeding: Independent Bathing: Independent Toileting: Independent In/Out Bed: Independent Walks in Home: Independent Does the patient have difficulty walking or climbing stairs?: No Weakness of Legs: None Weakness of Arms/Hands: None  Permission Sought/Granted                  Emotional Assessment Appearance:: Appears stated age Attitude/Demeanor/Rapport: Engaged Affect (typically observed): Accepting Orientation: : Oriented to Self,Oriented to Place,Oriented to  Time,Oriented to Situation Alcohol / Substance Use: Not Applicable Psych Involvement: No (comment)  Admission diagnosis:  Acute respiratory distress [R06.03] Elevated troponin [R77.8] COPD exacerbation ([place]) [J44.1] Patient Active Problem List   Diagnosis Date Noted  . Acute respiratory distress 01/03/2021  . COPD with acute bronchitis ([place]) 01/03/2021  . Elevated brain natriuretic peptide (BNP) level 01/03/2021  .  Elevated troponin 01/03/2021  . Macrocytic anemia 01/03/2021  . Viral pneumonitis 01/03/2021  . History of atrial fibrillation 01/03/2021  . Acute respiratory failure with hypoxia ([place]) 01/03/2021  . COPD with acute exacerbation ([place]) 01/03/2021  .  Chronic diastolic CHF (congestive heart failure) ([place]) 01/03/2021  . Acute diastolic CHF (congestive heart failure) ([place]) 10/24/2019  . Bilateral lower extremity edema   . Lymphedema 10/23/2019  . Acute CHF (congestive heart failure) ([place]) 10/23/2019  . Obesity, Class III, BMI 40-49.9 (morbid obesity) ([place]) 10/23/2019  . Atrial fibrillation with RVR ([place]) 10/22/2019  . Hypertension   . COPD (chronic obstructive pulmonary disease) ([place])   . Tobacco use    PCP:  Soyla Dryer, PA-C Pharmacy:   [facility], [address], [address], [place] [address] Phone: [phone] Fax: [phone]     Social Determinants of Health (SDOH) Interventions    Readmission Risk Interventions Readmission Risk Prevention Plan 10/30/2019 10/23/2019  Post Dischage Appt Complete Complete  Medication Screening Complete Complete  Transportation Screening Complete Complete

## 2021-01-04 NOTE — Progress Notes (Signed)
[facility] PROGRESS NOTE    Adam Marquez  HFW:[id number] DOB: [birth year] DOA: 01/03/2021 PCP: [facility], PA-C      Brief Narrative:  Adam Marquez is a 65 y.o. M with Afib on Xarelto, HTN, obesity and prediabetes, smoking/COPD not on O2, and dCHF who presented with few days progressive shortness of breath, active cough, and fever to 101F.  In the ER, temp 100 point 64F, tachycardic, O2 91% on room air.     Assessment & Plan:  Acute exacerbation COPD Acute respiratory failure ruled out Sepsis ruled out Patient presented with fever, tachycardia and tachypnea, in the setting of COPD exacerbation, likely viral pneumonitis.  Covid negative.  Cultures negative, procalcitonin low.  -Stop meropenem, narrow to azithromycin -Continue Solu-Medrol -Continue scheduled bronchodilators -Follow blood cultures   Elevated troponin Due to demand ischemia from COPD exacerbation.  Cardiology were consulted, recommend outpatient follow-up.  Chronic diastolic CHF Hypertension Appears euvolemic -Continue home furosemide -Hold home losartan  Pre-diabetes Diabetes ruled out Patient's hemoglobin A1c is less than 6%. On steroids -Continue sliding scale corrections  Persistent atrial fibrillation -Continue Xarelto  Thrombocytopenia Mild, stable relative to baseline  Incorrectly coded Covid test The patient had a point-of-care Covid antigen test initially documented as positive.  This was incorrect documentation, the test was read wrong, and was negative.  Follow-up PCR was also negative.  Hyponatremia Mild, asymptomatic  Other medications -Continue citalopram -Continue PPI here used for ulcer ppx, transition to PO       Disposition: Status is: Inpatient  Remains inpatient appropriate because:IV treatments appropriate due to intensity of illness or inability to take PO  And ongoing severe SOB with minimal exertion  Dispo: The patient is from:  Home              Anticipated d/c is to: Home              Anticipated d/c date is: 1 day              Patient currently is not medically stable to d/c.     Patient admitted with COPD exacerbation.  He is weaned to room air, but still has significant dyspnea and dizziness with exertion just a few feet.         MDM: The below labs and imaging reports were reviewed and summarized above.  Medication management as above.  This is a severe exacerbation of his chronic illness.  DVT prophylaxis: SCDs Start: 01/03/21 0803 rivaroxaban (XARELTO) tablet 20 mg  Code Status: Full code Family Communication:         Subjective: The patient is still very dyspneic with just walking to the bathroom and back.  He has no confusion, no sputum.  No vomiting.  No chest pain with exertion.  Swelling orthopnea.  Objective: Vitals:   01/04/21 0532 01/04/21 0725 01/04/21 1327 01/04/21 1438  BP: 137/78   (!) 157/91  Pulse: 86   64  Resp: 20   20  Temp: 97.9 F (36.6 C)     TempSrc: Oral     SpO2: 97% 97% 98% 91%  Weight:      Height:        Intake/Output Summary (Last 24 hours) at 01/04/2021 1833 Last data filed at 01/04/2021 1519 Gross per 24 hour  Intake 247.76 ml  Output 200 ml  Net 47.76 ml   Filed Weights   01/03/21 0207 01/03/21 0750 01/04/21 0500  Weight: (!) 140.6 kg (!) 139.2 kg (!) 138  kg    Examination: General appearance:  adult male, alert and in mild respiratory distress.  Sitting on the edge of the bed HEENT: Anicteric, conjunctiva pink, lids and lashes normal. No nasal deformity, discharge, epistaxis.  Lips moist.   Skin: Warm and dry.  no jaundice.  No suspicious rashes or lesions. Cardiac: RRR, nl S1-S2, no murmurs appreciated.  Capillary refill is brisk.  JVP not visible.  No LE edema.  Radial pulses 2+ and symmetric. Respiratory: Appears dyspneic with exertion.  Lung sounds very diminished, poor air movement, rhonchi bilaterally, no wheezing appreciated.  No  rales. Abdomen: Abdomen soft.  No TTP or guarding. No ascites, distension, hepatosplenomegaly.   MSK: No deformities or effusions. Neuro: Awake and alert.  EOMI, moves all extremities. Speech fluent.    Psych: Sensorium intact and responding to questions, attention normal. Affect normal.  Judgment and insight appear normal.    Data Reviewed: I have personally reviewed following labs and imaging studies:  CBC: Recent Labs  Lab 01/03/21 0243 01/04/21 0406  WBC 4.9 7.3  NEUTROABS 3.9  --   HGB 17.6* 16.4  HCT 53.5* 49.8  MCV 103.1* 103.1*  PLT 107* 790*   Basic Metabolic Panel: Recent Labs  Lab 01/03/21 0243 01/04/21 0406  NA 132* 132*  K 4.1 4.3  CL 93* 93*  CO2 24 27  GLUCOSE 120* 158*  BUN 10 25*  CREATININE 1.10 1.08  CALCIUM 9.4 9.1  MG  --  2.1  PHOS  --  3.9   GFR: Estimated Creatinine Clearance: 102.1 mL/min (by C-G formula based on SCr of 1.08 mg/dL). Liver Function Tests: Recent Labs  Lab 01/03/21 0243 01/04/21 0406  AST 28 29  ALT 16 16  ALKPHOS 74 60  BILITOT 1.2 0.8  PROT 8.0 7.7  ALBUMIN 4.2 3.7   No results for input(s): LIPASE, AMYLASE in the last 168 hours. No results for input(s): AMMONIA in the last 168 hours. Coagulation Profile: Recent Labs  Lab 01/04/21 0406  INR 1.9*   Cardiac Enzymes: No results for input(s): CKTOTAL, CKMB, CKMBINDEX, TROPONINI in the last 168 hours. BNP (last 3 results) No results for input(s): PROBNP in the last 8760 hours. HbA1C: Recent Labs    01/03/21 0434  HGBA1C 5.8*   CBG: Recent Labs  Lab 01/03/21 1722 01/03/21 2115 01/04/21 0829 01/04/21 1146 01/04/21 1655  GLUCAP 195* 149* 160* 240* 201*   Lipid Profile: Recent Labs    01/03/21 0243  TRIG 96   Thyroid Function Tests: Recent Labs    01/03/21 0847  TSH 1.909  FREET4 1.09   Anemia Panel: Recent Labs    01/03/21 0243 01/03/21 0847  VITAMINB12  --  233  FOLATE  --  12.9  FERRITIN 171  --    Urine analysis:    Component  Value Date/Time   COLORURINE YELLOW 10/22/2019 2237   APPEARANCEUR CLEAR 10/22/2019 2237   LABSPEC 1.017 10/22/2019 2237   PHURINE 6.0 10/22/2019 2237   GLUCOSEU NEGATIVE 10/22/2019 2237   HGBUR NEGATIVE 10/22/2019 2237   [address] NEGATIVE 10/22/2019 2237   KETONESUR 5 (A) 10/22/2019 2237   PROTEINUR 30 (A) 10/22/2019 2237   NITRITE NEGATIVE 10/22/2019 2237   LEUKOCYTESUR NEGATIVE 10/22/2019 2237   Sepsis Labs: @LABRCNTIP (procalcitonin:4,lacticacidven:4)  ) Recent Results (from the past 240 hour(s))  Blood Culture (routine x 2)     Status: None (Preliminary result)   Collection Time: 01/03/21  2:43 AM   Specimen: BLOOD  Result Value Ref Range Status  Specimen Description BLOOD RIGHT ANTECUBITAL  Final   Special Requests   Final    BOTTLES DRAWN AEROBIC AND ANAEROBIC Blood Culture adequate volume   Culture   Final    NO GROWTH 1 DAY Performed at [facility], [address] [place], [place] [address]    Report Status PENDING  Incomplete  Resp Panel by RT-PCR (Flu A&B, Covid) Nasopharyngeal Swab     Status: None   Collection Time: 01/03/21  2:47 AM   Specimen: Nasopharyngeal Swab; Nasopharyngeal(NP) swabs in vial transport medium  Result Value Ref Range Status   SARS Coronavirus 2 by RT PCR NEGATIVE NEGATIVE Final    Comment: (NOTE) SARS-CoV-2 target nucleic acids are NOT DETECTED.  The SARS-CoV-2 RNA is generally detectable in upper respiratory specimens during the acute phase of infection. The lowest concentration of SARS-CoV-2 viral copies this assay can detect is 138 copies/mL. A negative result does not preclude SARS-Cov-2 infection and should not be used as the sole basis for treatment or other patient management decisions. A negative result may occur with  improper specimen collection/handling, submission of specimen other than nasopharyngeal swab, presence of viral mutation(s) within the areas targeted by this assay, and inadequate number of viral copies(<138  copies/mL). A negative result must be combined with clinical observations, patient history, and epidemiological information. The expected result is Negative.  Fact Sheet for Patients:  BloggerCourse.com  Fact Sheet for Healthcare Providers:  SeriousBroker.it  This test is no t yet approved or cleared by the [place] FDA and  has been authorized for detection and/or diagnosis of SARS-CoV-2 by FDA under an Emergency Use Authorization (EUA). This EUA will remain  in effect (meaning this test can be used) for the duration of the COVID-19 declaration under Section 564(b)(1) of the Act, 21 U.S.C.section 360bbb-3(b)(1), unless the authorization is terminated  or revoked sooner.       Influenza A by PCR NEGATIVE NEGATIVE Final   Influenza B by PCR NEGATIVE NEGATIVE Final    Comment: (NOTE) The Xpert Xpress SARS-CoV-2/FLU/RSV plus assay is intended as an aid in the diagnosis of influenza from Nasopharyngeal swab specimens and should not be used as a sole basis for treatment. Nasal washings and aspirates are unacceptable for Xpert Xpress SARS-CoV-2/FLU/RSV testing.  Fact Sheet for Patients: BloggerCourse.com  Fact Sheet for Healthcare Providers: SeriousBroker.it  This test is not yet approved or cleared by the [place] FDA and has been authorized for detection and/or diagnosis of SARS-CoV-2 by FDA under an Emergency Use Authorization (EUA). This EUA will remain in effect (meaning this test can be used) for the duration of the COVID-19 declaration under Section 564(b)(1) of the Act, 21 U.S.C. section 360bbb-3(b)(1), unless the authorization is terminated or revoked.  Performed at [facility], [address], [place], [place] [address]   Blood Culture (routine x 2)     Status: None (Preliminary result)   Collection Time: 01/03/21  4:34 AM   Specimen: BLOOD  Result Value Ref  Range Status   Specimen Description BLOOD RIGHT ANTECUBITAL  Final   Special Requests AEROBIC BOTTLE ONLY Blood Culture adequate volume  Final   Culture   Final    NO GROWTH 1 DAY Performed at [facility], [address], [place], [place] [address]    Report Status PENDING  Incomplete         Radiology Studies: DG Chest Port 1 View  Result Date: 01/03/2021 CLINICAL DATA:  Short of breath for 3 days, fever and cough  EXAM: PORTABLE CHEST 1 VIEW COMPARISON:  10/22/2019 FINDINGS: 2 frontal views of the chest demonstrate a stable cardiac silhouette. No acute airspace disease, effusion, or pneumothorax. Mild peribronchial cuffing. No acute bony abnormalities. IMPRESSION: 1. Mild bronchial wall thickening which could reflect reactive airway disease or viral pneumonitis. No lobar pneumonia. Electronically Signed   By: Sharlet Salina M.D.   On: 01/03/2021 02:37   ECHOCARDIOGRAM COMPLETE  Result Date: 01/03/2021    ECHOCARDIOGRAM REPORT   Patient Name:   Adam Marquez Date of Exam: 01/03/2021 Medical Rec #:  [id number]            Height:       72.0 in Accession #:    [id number]           Weight:       306.9 lb Date of Birth:  [birth year]            BSA:          2.556 m Patient Age:    64 years             BP:           125/72 mmHg Patient Gender: M                    HR:           92 bpm. [facility] Procedure: 2D Echo, Cardiac Doppler and Color Doppler Indications:    CHF-Acute Diastolic I50.31  History:        Patient has prior history of Echocardiogram examinations, most                 recent 10/29/2019. COPD, Arrythmias:Atrial Fibrillation; Risk                 Factors:Hypertension, Tobacco use, Dyslipidemia and Diabetes.                 Obesity, Acute respiratory failure with hypoxia.  Sonographer:    Celesta Gentile RCS Referring Phys: [id number] OLADAPO ADEFESO IMPRESSIONS  1. Left ventricular ejection fraction, by estimation, is 50 to 55%. The left ventricle has low normal function.  The left ventricle has no regional wall motion abnormalities. There is mild left ventricular hypertrophy. Left ventricular diastolic parameters are indeterminate.  2. Right ventricular systolic function is low normal. The right ventricular size is mildly enlarged. Tricuspid regurgitation signal is inadequate for assessing PA pressure.  3. The mitral valve is grossly normal. Trivial mitral valve regurgitation.  4. The aortic valve is tricuspid. There is mild calcification of the aortic valve. Aortic valve regurgitation is not visualized.  5. The inferior vena cava is normal in size with greater than 50% respiratory variability, suggesting right atrial pressure of 3 mmHg. FINDINGS  Left Ventricle: Left ventricular ejection fraction, by estimation, is 50 to 55%. The left ventricle has low normal function. The left ventricle has no regional wall motion abnormalities. The left ventricular internal cavity size was normal in size. There is mild left ventricular hypertrophy. Left ventricular diastolic parameters are indeterminate. Right Ventricle: The right ventricular size is mildly enlarged. No increase in right ventricular wall thickness. Right ventricular systolic function is low normal. Tricuspid regurgitation signal is inadequate for assessing PA pressure. Left Atrium: Left atrial size was normal in size. Right Atrium: Right atrial size was normal in size. Pericardium: There is no evidence of pericardial effusion. Mitral Valve: The mitral valve is grossly normal. Mild mitral annular calcification. Trivial mitral  valve regurgitation. Tricuspid Valve: The tricuspid valve is grossly normal. Tricuspid valve regurgitation is trivial. Aortic Valve: The aortic valve is tricuspid. There is mild calcification of the aortic valve. There is mild aortic valve annular calcification. Aortic valve regurgitation is not visualized. Pulmonic Valve: The pulmonic valve was grossly normal. Pulmonic valve regurgitation is trivial. Aorta:  The aortic root is normal in size and structure. Venous: The inferior vena cava is normal in size with greater than 50% respiratory variability, suggesting right atrial pressure of 3 mmHg. IAS/Shunts: No atrial level shunt detected by color flow Doppler.  LEFT VENTRICLE PLAX 2D LVIDd:         5.20 cm  Diastology LVIDs:         4.10 cm  LV e' medial:    3.48 cm/s LV PW:         1.30 cm  LV E/e' medial:  18.7 LV IVS:        1.20 cm  LV e' lateral:   3.48 cm/s LVOT diam:     2.00 cm  LV E/e' lateral: 18.7 LV SV:         57 LV SV Index:   22 LVOT Area:     3.14 cm  RIGHT VENTRICLE RV S prime:     11.40 cm/s TAPSE (M-mode): 2.5 cm LEFT ATRIUM             Index       RIGHT ATRIUM           Index LA diam:        4.60 cm 1.80 cm/m  RA Area:     23.50 cm LA Vol (A2C):   65.2 ml 25.51 ml/m RA Volume:   78.40 ml  30.68 ml/m LA Vol (A4C):   43.4 ml 16.98 ml/m LA Biplane Vol: 53.5 ml 20.93 ml/m  AORTIC VALVE LVOT Vmax:   101.35 cm/s LVOT Vmean:  66.050 cm/s LVOT VTI:    0.182 m  AORTA Ao Root diam: 3.20 cm MITRAL VALVE MV Area (PHT): 3.17 cm    SHUNTS MV Decel Time: 239 msec    Systemic VTI:  0.18 m MV E velocity: 65.10 cm/s  Systemic Diam: 2.00 cm MV A velocity: 75.40 cm/s MV E/A ratio:  0.86 Nona Dell MD Electronically signed by Nona Dell MD Signature Date/Time: 01/03/2021/11:41:09 AM    Final         Scheduled Meds: . Melene Muller ON 01/05/2021] azithromycin  250 mg Oral Daily  . budesonide (PULMICORT) nebulizer solution  0.5 mg Nebulization BID  . citalopram  20 mg Oral Daily  . dextromethorphan-guaiFENesin  1 tablet Oral BID  . furosemide  40 mg Oral Daily  . insulin aspart  0-15 Units Subcutaneous TID WC  . insulin aspart  0-5 Units Subcutaneous QHS  . ipratropium  0.5 mg Nebulization Q6H  . levalbuterol  0.63 mg Nebulization Q6H  . methylPREDNISolone (SOLU-MEDROL) injection  60 mg Intravenous Q12H  . [START ON 01/05/2021] pantoprazole  40 mg Oral Daily  . rivaroxaban  20 mg Oral Q supper    Continuous Infusions:    LOS: 0 days    Time spent: 35 minutes    Alberteen Sam, MD [facility] 01/04/2021, 6:33 PM     Please page though AMION or Epic secure chat:  For [employer], Higher education careers adviser

## 2021-01-05 DIAGNOSIS — R739 Hyperglycemia, unspecified: Secondary | ICD-10-CM | POA: Diagnosis present

## 2021-01-05 DIAGNOSIS — I5031 Acute diastolic (congestive) heart failure: Secondary | ICD-10-CM

## 2021-01-05 LAB — GLUCOSE, CAPILLARY
Glucose-Capillary: 165 mg/dL — ABNORMAL HIGH (ref 70–99)
Glucose-Capillary: 278 mg/dL — ABNORMAL HIGH (ref 70–99)
Glucose-Capillary: 164 mg/dL — ABNORMAL HIGH (ref 70–99)
Glucose-Capillary: 143 mg/dL — ABNORMAL HIGH (ref 70–99)

## 2021-01-05 LAB — BASIC METABOLIC PANEL
Anion gap: 10 (ref 5–15)
BUN: 28 mg/dL — ABNORMAL HIGH (ref 8–23)
CO2: 29 mmol/L (ref 22–32)
Calcium: 9.2 mg/dL (ref 8.9–10.3)
Chloride: 94 mmol/L — ABNORMAL LOW (ref 98–111)
Creatinine, Ser: 0.93 mg/dL (ref 0.61–1.24)
GFR, Estimated: >60 mL/min (ref >60)
Glucose, Bld: 182 mg/dL — ABNORMAL HIGH (ref 70–99)
Potassium: 4.5 mmol/L (ref 3.5–5.1)
Sodium: 133 mmol/L — ABNORMAL LOW (ref 135–145)

## 2021-01-05 LAB — CBC
HCT: 51.3 % (ref 39.0–52.0)
Hemoglobin: 16.7 g/dL (ref 13.0–17.0)
MCH: 33.7 pg (ref 26.0–34.0)
MCHC: 32.6 g/dL (ref 30.0–36.0)
MCV: 103.6 fL — ABNORMAL HIGH (ref 80.0–100.0)
Platelets: 125 10*3/uL — ABNORMAL LOW (ref 150–400)
RBC: 4.95 MIL/uL (ref 4.22–5.81)
RDW: 14.2 % (ref 11.5–15.5)
WBC: 7.4 10*3/uL (ref 4.0–10.5)
nRBC: 0 % (ref 0.0–0.2)

## 2021-01-05 LAB — MAGNESIUM: Magnesium: 2.4 mg/dL (ref 1.7–2.4)

## 2021-01-05 MED ORDER — FUROSEMIDE 10 MG/ML IJ SOLN
20.0000 mg | Freq: Two times a day (BID) | INTRAMUSCULAR | Status: DC
  Administered 2021-01-05 – 2021-01-08 (×6): 20 mg via INTRAVENOUS
  Filled 2021-01-05 (×6): qty 2

## 2021-01-05 MED ORDER — DOXYCYCLINE HYCLATE 100 MG PO TABS
100.0000 mg | ORAL_TABLET | Freq: Two times a day (BID) | ORAL | Status: DC
Start: 2021-01-05 — End: 2021-01-08
  Administered 2021-01-05 – 2021-01-08 (×7): 100 mg via ORAL
  Filled 2021-01-05 (×7): qty 1

## 2021-01-05 MED ORDER — FUROSEMIDE 10 MG/ML IJ SOLN
40.0000 mg | Freq: Once | INTRAMUSCULAR | Status: AC
  Administered 2021-01-05: 40 mg via INTRAVENOUS
  Filled 2021-01-05: qty 4

## 2021-01-05 MED ORDER — IPRATROPIUM BROMIDE 0.02 % IN SOLN
0.5000 mg | Freq: Three times a day (TID) | RESPIRATORY_TRACT | Status: DC
  Administered 2021-01-06: 0.5 mg via RESPIRATORY_TRACT
  Filled 2021-01-05: qty 2.5

## 2021-01-05 MED ORDER — LEVALBUTEROL HCL 0.63 MG/3ML IN NEBU
0.6300 mg | INHALATION_SOLUTION | Freq: Three times a day (TID) | RESPIRATORY_TRACT | Status: DC
  Administered 2021-01-06: 0.63 mg via RESPIRATORY_TRACT
  Filled 2021-01-05: qty 3

## 2021-01-05 NOTE — Progress Notes (Signed)
Notified by telemetry hr dropping into 20's. Patient asymptomatic other VSS. Notified on-call order for EKG completed. Will continue to monitor.

## 2021-01-05 NOTE — Progress Notes (Addendum)
PROGRESS NOTE  Adam Marquez YJE:[id number] DOB: 06-27-1956 DOA: 01/03/2021 PCP: Jacquelin Hawking, PA-C  HPI/Recap of past 33 hours: 65 year old male with past medical history of atrial fibrillation on Xarelto, morbid obesity, tobacco use and undiagnosed COPD plus diastolic CHF presented to the emergency room with several days of progressively worsening shortness of breath, cough and fever.  In the emergency room, noted to have a temperature of 100.4, tachycardic and oxygen saturations at 91% on room air at rest, but with ambulation, these oxygen saturations dropped to 86%.  BNP elevated at 456.  Patient admitted for COPD/CHF exacerbation.  At time of admission, also noted to have mildly elevated troponins.  On the night of 1/5, patient had episodes of prolonged QT interval and bradycardia down to the 20s.  Electrolytes checked and found to be normal.  Patient on Zithromax which was discontinued and changed to doxycycline.  Following hospitalization, patient is feeling better although he gets very lightheaded and short of breath when ambulating even short distances to the bathroom.  Denies any pain.  Assessment/Plan: Principal problem: Acute respiratory failure with hypoxia secondary to COPD with acute exacerbation plus acute on chronic diastolic heart failure: Have started IV Lasix.  Continue steroids and nebulizers.  Continue antibiotics for underlying bronchitis.  I ambulated him in the hall on room air and he was mildly tachycardic with a heart rate around 105 and an oxygen saturation of 87%.  Improving very slowly.  Active Problems:   Hypertension: Blood pressures still somewhat elevated, likely in the setting of fluid overload.  Should improve with diuresis.    Tobacco use: Patient has been smoking 35 years.  He tells me that he no longer wants to smoke and has not smoked in 2 weeks.  Prolonged QTC/bradycardia: Noted as above.  Antibiotics changed.    Obesity, Class III, BMI  40-49.9 (morbid obesity) (HCC): Meets criteria with BMI greater than 40.    Elevated troponin: Mild.  Started at 275 and peaked as high as 521 and now back down.  Likely in the setting of respiratory failure, mild CHF   Macrocytic anemia   History of atrial fibrillation: Stable, currently normal sinus rhythm.  Rate controlled. )   Hyperglycemia: Having hyperglycemia due to steroids.  He is prediabetic with an A1c checked this time at 5.8.  Covering with sliding scale.  Incorrectly coded Covid test The patient had a point-of-care Covid antigen test initially documented as positive.  This was incorrect documentation, the test was read wrong, and was negative.  Follow-up PCR was also negative.   Code Status: Full code  Family Communication: Declined for me to call family, he is keeping his daughter updated  Disposition Plan: Potential discharge in next 1 to 2 days once he is better diuresed and able to ambulate and keep oxygen saturations above 90%   Consultants:  None  Procedures:  None  Antimicrobials:  IV meropenem 1/4-1/5  Zithromax 1/5  Doxycycline 1/5-present  DVT prophylaxis: Xarelto, SCDs   Objective: Vitals:   01/05/21 0738 01/05/21 1254  BP:  (!) 165/99  Pulse:  (!) 59  Resp:  18  Temp:  97.7 F (36.5 C)  SpO2: 94% 94%    Intake/Output Summary (Last 24 hours) at 01/05/2021 1255 Last data filed at 01/04/2021 1519 Gross per 24 hour  Intake 247.76 ml  Output -  Net 247.76 ml   Filed Weights   01/03/21 0750 01/04/21 0500 01/05/21 0456  Weight: (!) 139.2 kg (!) 138 kg [name] Kitchen)  139.9 kg   Body mass index is 41.83 kg/m.  Exam:   General: Alert and oriented x3, no acute distress  HEENT: Normocephalic, atraumatic, and neck is thick, narrow airway  Cardiovascular: Regular rate and rhythm, S1-S2, borderline tachycardia  Respiratory: Decreased breath sounds throughout with mild end expiratory wheeze in the upper right lobe  Abdomen: Soft, nontender,  nondistended, positive bowel sounds  Musculoskeletal: No clubbing or cyanosis, trace pitting edema  Skin: No skin breaks, tears or lesions  Neuro: No focal deficits  Psychiatry: Appropriate, no evidence of psychoses   Data Reviewed: CBC: Recent Labs  Lab 01/03/21 0243 01/04/21 0406 01/05/21 0414  WBC 4.9 7.3 7.4  NEUTROABS 3.9  --   --   HGB 17.6* 16.4 16.7  HCT 53.5* 49.8 51.3  MCV 103.1* 103.1* 103.6*  PLT 107* 114* 824*   Basic Metabolic Panel: Recent Labs  Lab 01/03/21 0243 01/04/21 0406 01/05/21 0414  NA 132* 132* 133*  K 4.1 4.3 4.5  CL 93* 93* 94*  CO2 24 27 29   GLUCOSE 120* 158* 182*  BUN 10 25* 28*  CREATININE 1.10 1.08 0.93  CALCIUM 9.4 9.1 9.2  MG  --  2.1 2.4  PHOS  --  3.9  --    GFR: Estimated Creatinine Clearance: 119.4 mL/min (by C-G formula based on SCr of 0.93 mg/dL). Liver Function Tests: Recent Labs  Lab 01/03/21 0243 01/04/21 0406  AST 28 29  ALT 16 16  ALKPHOS 74 60  BILITOT 1.2 0.8  PROT 8.0 7.7  ALBUMIN 4.2 3.7   No results for input(s): LIPASE, AMYLASE in the last 168 hours. No results for input(s): AMMONIA in the last 168 hours. Coagulation Profile: Recent Labs  Lab 01/04/21 0406  INR 1.9*   Cardiac Enzymes: No results for input(s): CKTOTAL, CKMB, CKMBINDEX, TROPONINI in the last 168 hours. BNP (last 3 results) No results for input(s): PROBNP in the last 8760 hours. HbA1C: Recent Labs    01/03/21 0434  HGBA1C 5.8*   CBG: Recent Labs  Lab 01/04/21 1146 01/04/21 1655 01/04/21 2014 01/05/21 0722 01/05/21 1121  GLUCAP 240* 201* 180* 165* 278*   Lipid Profile: Recent Labs    01/03/21 0243  TRIG 96   Thyroid Function Tests: Recent Labs    01/03/21 0847  TSH 1.909  FREET4 1.09   Anemia Panel: Recent Labs    01/03/21 0243 01/03/21 0847  VITAMINB12  --  233  FOLATE  --  12.9  FERRITIN 171  --    Urine analysis:    Component Value Date/Time   COLORURINE YELLOW 10/22/2019 2237   APPEARANCEUR  CLEAR 10/22/2019 2237   LABSPEC 1.017 10/22/2019 2237   PHURINE 6.0 10/22/2019 2237   GLUCOSEU NEGATIVE 10/22/2019 2237   HGBUR NEGATIVE 10/22/2019 2237   [place] NEGATIVE 10/22/2019 2237   KETONESUR 5 (A) 10/22/2019 2237   PROTEINUR 30 (A) 10/22/2019 2237   NITRITE NEGATIVE 10/22/2019 2237   LEUKOCYTESUR NEGATIVE 10/22/2019 2237   Sepsis Labs: @LABRCNTIP (procalcitonin:4,lacticidven:4)  ) Recent Results (from the past 240 hour(s))  Blood Culture (routine x 2)     Status: None (Preliminary result)   Collection Time: 01/03/21  2:43 AM   Specimen: BLOOD  Result Value Ref Range Status   Specimen Description BLOOD RIGHT ANTECUBITAL  Final   Special Requests   Final    BOTTLES DRAWN AEROBIC AND ANAEROBIC Blood Culture adequate volume   Culture   Final    NO GROWTH 2 DAYS Performed at [facility], [address], [place], [place] [address]    Report Status PENDING  Incomplete  Resp Panel by RT-PCR (Flu A&B, Covid) Nasopharyngeal Swab     Status: None   Collection Time: 01/03/21  2:47 AM   Specimen: Nasopharyngeal Swab; Nasopharyngeal(NP) swabs in vial transport medium  Result Value Ref Range Status   SARS Coronavirus 2 by RT PCR NEGATIVE NEGATIVE Final    Comment: (NOTE) SARS-CoV-2 target nucleic acids are NOT DETECTED.  The SARS-CoV-2 RNA is generally detectable in upper respiratory specimens during the acute phase of infection. The lowest concentration of SARS-CoV-2 viral copies this assay can detect is 138 copies/mL. A negative result does not preclude SARS-Cov-2 infection and should not be used as the sole basis for treatment or other patient management decisions. A negative result may occur with  improper specimen collection/handling, submission of specimen other than nasopharyngeal swab, presence of viral mutation(s) within the areas targeted by this assay, and inadequate number of viral copies(<138 copies/mL). A negative result must be combined with clinical  observations, patient history, and epidemiological information. The expected result is Negative.  Fact Sheet for Patients:  BloggerCourse.com  Fact Sheet for Healthcare Providers:  SeriousBroker.it  This test is no t yet approved or cleared by the [place] FDA and  has been authorized for detection and/or diagnosis of SARS-CoV-2 by FDA under an Emergency Use Authorization (EUA). This EUA will remain  in effect (meaning this test can be used) for the duration of the COVID-19 declaration under Section 564(b)(1) of the Act, 21 U.S.C.section 360bbb-3(b)(1), unless the authorization is terminated  or revoked sooner.       Influenza A by PCR NEGATIVE NEGATIVE Final   Influenza B by PCR NEGATIVE NEGATIVE Final    Comment: (NOTE) The Xpert Xpress SARS-CoV-2/FLU/RSV plus assay is intended as an aid in the diagnosis of influenza from Nasopharyngeal swab specimens and should not be used as a sole basis for treatment. Nasal washings and aspirates are unacceptable for Xpert Xpress SARS-CoV-2/FLU/RSV testing.  Fact Sheet for Patients: BloggerCourse.com  Fact Sheet for Healthcare Providers: SeriousBroker.it  This test is not yet approved or cleared by the [place] FDA and has been authorized for detection and/or diagnosis of SARS-CoV-2 by FDA under an Emergency Use Authorization (EUA). This EUA will remain in effect (meaning this test can be used) for the duration of the COVID-19 declaration under Section 564(b)(1) of the Act, 21 U.S.C. section 360bbb-3(b)(1), unless the authorization is terminated or revoked.  Performed at [facility], [address] [place], [place] [address]   Blood Culture (routine x 2)     Status: None (Preliminary result)   Collection Time: 01/03/21  4:34 AM   Specimen: BLOOD  Result Value Ref Range Status   Specimen Description BLOOD RIGHT ANTECUBITAL   Final   Special Requests AEROBIC BOTTLE ONLY Blood Culture adequate volume  Final   Culture   Final    NO GROWTH 2 DAYS Performed at [facility], [address], [place], [place] [address]    Report Status PENDING  Incomplete      Studies: No results found.  Scheduled Meds: . budesonide (PULMICORT) nebulizer solution  0.5 mg Nebulization BID  . citalopram  20 mg Oral Daily  . dextromethorphan-guaiFENesin  1 tablet Oral BID  . doxycycline  100 mg Oral Q12H  . insulin aspart  0-15 Units Subcutaneous TID WC  . insulin aspart  0-5 Units Subcutaneous QHS  . ipratropium  0.5 mg Nebulization Q6H  .  levalbuterol  0.63 mg Nebulization Q6H  . methylPREDNISolone (SOLU-MEDROL) injection  60 mg Intravenous Q12H  . pantoprazole  40 mg Oral Daily  . rivaroxaban  20 mg Oral Q supper    Continuous Infusions:   LOS: 1 day     Hollice Espy, MD [facility]   01/05/2021, 12:55 PM

## 2021-01-06 LAB — BASIC METABOLIC PANEL
Anion gap: 10 (ref 5–15)
BUN: 33 mg/dL — ABNORMAL HIGH (ref 8–23)
CO2: 30 mmol/L (ref 22–32)
Calcium: 9.6 mg/dL (ref 8.9–10.3)
Chloride: 96 mmol/L — ABNORMAL LOW (ref 98–111)
Creatinine, Ser: 0.9 mg/dL (ref 0.61–1.24)
GFR, Estimated: >60 mL/min (ref >60)
Glucose, Bld: 166 mg/dL — ABNORMAL HIGH (ref 70–99)
Potassium: 4.5 mmol/L (ref 3.5–5.1)
Sodium: 136 mmol/L (ref 135–145)

## 2021-01-06 LAB — GLUCOSE, CAPILLARY
Glucose-Capillary: 133 mg/dL — ABNORMAL HIGH (ref 70–99)
Glucose-Capillary: 225 mg/dL — ABNORMAL HIGH (ref 70–99)
Glucose-Capillary: 111 mg/dL — ABNORMAL HIGH (ref 70–99)
Glucose-Capillary: 97 mg/dL (ref 70–99)

## 2021-01-06 MED ORDER — LEVALBUTEROL HCL 0.63 MG/3ML IN NEBU
0.6300 mg | INHALATION_SOLUTION | Freq: Two times a day (BID) | RESPIRATORY_TRACT | Status: DC
  Administered 2021-01-06 – 2021-01-08 (×4): 0.63 mg via RESPIRATORY_TRACT
  Filled 2021-01-06 (×4): qty 3

## 2021-01-06 MED ORDER — IPRATROPIUM BROMIDE 0.02 % IN SOLN
0.5000 mg | Freq: Two times a day (BID) | RESPIRATORY_TRACT | Status: DC
  Administered 2021-01-06 – 2021-01-08 (×4): 0.5 mg via RESPIRATORY_TRACT
  Filled 2021-01-06 (×4): qty 2.5

## 2021-01-06 MED ORDER — METOPROLOL TARTRATE 50 MG PO TABS
50.0000 mg | ORAL_TABLET | Freq: Two times a day (BID) | ORAL | Status: DC
  Administered 2021-01-06 – 2021-01-08 (×5): 50 mg via ORAL
  Filled 2021-01-06 (×5): qty 1

## 2021-01-06 MED ORDER — LOSARTAN POTASSIUM 50 MG PO TABS
100.0000 mg | ORAL_TABLET | Freq: Every day | ORAL | Status: DC
  Administered 2021-01-06 – 2021-01-08 (×3): 100 mg via ORAL
  Filled 2021-01-06 (×3): qty 2

## 2021-01-06 NOTE — Progress Notes (Signed)
PROGRESS NOTE  Dariusz Brase Boyko GMW:[id number] DOB: May 18, 1956 DOA: 01/03/2021 PCP: Jacquelin Hawking, PA-C  HPI/Recap of past 49 hours: 65 year old male with past medical history of atrial fibrillation on Xarelto, morbid obesity, tobacco use and undiagnosed COPD plus diastolic CHF presented to the emergency room with several days of progressively worsening shortness of breath, cough and fever.  In the emergency room, noted to have a temperature of 100.4, tachycardic and oxygen saturations at 91% on room air at rest, but with ambulation, these oxygen saturations dropped to 86%.  BNP elevated at 456.  Patient admitted for COPD/CHF exacerbation.  At time of admission, also noted to have mildly elevated troponins.  On the night of 1/5, patient had episodes of prolonged QT interval and bradycardia down to the 20s.  Electrolytes checked and found to be normal.  Patient on Zithromax which was discontinued and changed to doxycycline.  Since admission, has diuresed almost 1/2 L of fluid.  Breathing is getting easier.    Assessment/Plan: Principal problem: Acute respiratory failure with hypoxia secondary to COPD with acute exacerbation plus acute on chronic diastolic heart failure: Have started IV Lasix.  Continue steroids and nebulizers.  Continue antibiotics for underlying bronchitis.  Still mildly hypoxic when ambulating, although he is feeling better and less tachycardia  Active Problems:   Hypertension: Blood pressures still somewhat elevated, likely in the setting of fluid overload.  Should improve with diuresis.    Tobacco use: Patient has been smoking 35 years.  He tells me that he no longer wants to smoke and has not smoked in 2 weeks.  Prolonged QTC/bradycardia: Noted as above.  Antibiotics changed.    Obesity, Class III, BMI 40-49.9 (morbid obesity) (HCC): Meets criteria with BMI greater than 40.    Elevated troponin: Mild.  Started at 275 and peaked as high as 521 and now back down.   Likely in the setting of respiratory failure, mild CHF   Macrocytic anemia   History of atrial fibrillation: Stable, currently normal sinus rhythm.  Rate controlled. )   Hyperglycemia: Having hyperglycemia due to steroids.  He is prediabetic with an A1c checked this time at 5.8.  Covering with sliding scale.  Incorrectly coded Covid test The patient had a point-of-care Covid antigen test initially documented as positive.  This was incorrect documentation, the test was read wrong, and was negative.  Follow-up PCR was also negative.   Code Status: Full code  Family Communication: Declined for me to call family, he is keeping his daughter updated  Disposition Plan: Anticipate discharge tomorrow, the point he should be fully diuresed and breathing more comfortably   Consultants:  None  Procedures:  None  Antimicrobials:  IV meropenem 1/4-1/5  Zithromax 1/5  Doxycycline 1/5-present  DVT prophylaxis: Xarelto, SCDs   Objective: Vitals:   01/06/21 0857 01/06/21 0904  BP:    Pulse:    Resp:    Temp:    SpO2: 98% 99%    Intake/Output Summary (Last 24 hours) at 01/06/2021 1252 Last data filed at 01/06/2021 1100 Gross per 24 hour  Intake 240 ml  Output 225 ml  Net 15 ml   Filed Weights   01/04/21 0500 01/05/21 0456 01/06/21 0521  Weight: (!) 138 kg (!) 139.9 kg (!) 139.1 kg   Body mass index is 41.59 kg/m.  Exam:   General: Alert and oriented x3, no acute distress  HEENT: Normocephalic, atraumatic, and neck is thick, narrow airway  Cardiovascular: Regular rate and rhythm, S1-S2, mild tachycardia  Respiratory: Decreased breath sounds throughout with mild end expiratory wheeze on right side  Abdomen: Soft, nontender, nondistended, positive bowel sounds  Musculoskeletal: No clubbing or cyanosis, trace pitting edema  Skin: No skin breaks, tears or lesions  Neuro: No focal deficits  Psychiatry: Appropriate, no evidence of psychoses   Data  Reviewed: CBC: Recent Labs  Lab 01/03/21 0243 01/04/21 0406 01/05/21 0414  WBC 4.9 7.3 7.4  NEUTROABS 3.9  --   --   HGB 17.6* 16.4 16.7  HCT 53.5* 49.8 51.3  MCV 103.1* 103.1* 103.6*  PLT 107* 114* 125*   Basic Metabolic Panel: Recent Labs  Lab 01/03/21 0243 01/04/21 0406 01/05/21 0414 01/06/21 0442  NA 132* 132* 133* 136  K 4.1 4.3 4.5 4.5  CL 93* 93* 94* 96*  CO2 24 27 29 30   GLUCOSE 120* 158* 182* 166*  BUN 10 25* 28* 33*  CREATININE 1.10 1.08 0.93 0.90  CALCIUM 9.4 9.1 9.2 9.6  MG  --  2.1 2.4  --   PHOS  --  3.9  --   --    GFR: Estimated Creatinine Clearance: 123 mL/min (by C-G formula based on SCr of 0.9 mg/dL). Liver Function Tests: Recent Labs  Lab 01/03/21 0243 01/04/21 0406  AST 28 29  ALT 16 16  ALKPHOS 74 60  BILITOT 1.2 0.8  PROT 8.0 7.7  ALBUMIN 4.2 3.7   No results for input(s): LIPASE, AMYLASE in the last 168 hours. No results for input(s): AMMONIA in the last 168 hours. Coagulation Profile: Recent Labs  Lab 01/04/21 0406  INR 1.9*   Cardiac Enzymes: No results for input(s): CKTOTAL, CKMB, CKMBINDEX, TROPONINI in the last 168 hours. BNP (last 3 results) No results for input(s): PROBNP in the last 8760 hours. HbA1C: No results for input(s): HGBA1C in the last 72 hours. CBG: Recent Labs  Lab 01/05/21 1121 01/05/21 1649 01/05/21 2108 01/06/21 0727 01/06/21 1104  GLUCAP 278* 164* 143* 133* 225*   Lipid Profile: No results for input(s): CHOL, HDL, LDLCALC, TRIG, CHOLHDL, LDLDIRECT in the last 72 hours. Thyroid Function Tests: No results for input(s): TSH, T4TOTAL, FREET4, T3FREE, THYROIDAB in the last 72 hours. Anemia Panel: No results for input(s): VITAMINB12, FOLATE, FERRITIN, TIBC, IRON, RETICCTPCT in the last 72 hours. Urine analysis:    Component Value Date/Time   COLORURINE YELLOW 10/22/2019 2237   APPEARANCEUR CLEAR 10/22/2019 2237   LABSPEC 1.017 10/22/2019 2237   PHURINE 6.0 10/22/2019 2237   GLUCOSEU NEGATIVE  10/22/2019 2237   HGBUR NEGATIVE 10/22/2019 2237   BILIRUBINUR NEGATIVE 10/22/2019 2237   KETONESUR 5 (A) 10/22/2019 2237   PROTEINUR 30 (A) 10/22/2019 2237   NITRITE NEGATIVE 10/22/2019 2237   LEUKOCYTESUR NEGATIVE 10/22/2019 2237   Sepsis Labs: @LABRCNTIP (procalcitonin:4,lacticidven:4)  ) Recent Results (from the past 240 hour(s))  Blood Culture (routine x 2)     Status: None (Preliminary result)   Collection Time: 01/03/21  2:43 AM   Specimen: BLOOD  Result Value Ref Range Status   Specimen Description BLOOD RIGHT ANTECUBITAL  Final   Special Requests   Final    BOTTLES DRAWN AEROBIC AND ANAEROBIC Blood Culture adequate volume   Culture   Final    NO GROWTH 3 DAYS Performed at [facility], [address], [place], [address] [place]    Report Status PENDING  Incomplete  Resp Panel by RT-PCR (Flu A&B, Covid) Nasopharyngeal Swab     Status: None   Collection Time: 01/03/21  2:47 AM   Specimen:  Nasopharyngeal Swab; Nasopharyngeal(NP) swabs in vial transport medium  Result Value Ref Range Status   SARS Coronavirus 2 by RT PCR NEGATIVE NEGATIVE Final    Comment: (NOTE) SARS-CoV-2 target nucleic acids are NOT DETECTED.  The SARS-CoV-2 RNA is generally detectable in upper respiratory specimens during the acute phase of infection. The lowest concentration of SARS-CoV-2 viral copies this assay can detect is 138 copies/mL. A negative result does not preclude SARS-Cov-2 infection and should not be used as the sole basis for treatment or other patient management decisions. A negative result may occur with  improper specimen collection/handling, submission of specimen other than nasopharyngeal swab, presence of viral mutation(s) within the areas targeted by this assay, and inadequate number of viral copies(<138 copies/mL). A negative result must be combined with clinical observations, patient history, and epidemiological information. The expected result is Negative.  Fact Sheet for  Patients:  EntrepreneurPulse.com.au  Fact Sheet for Healthcare Providers:  IncredibleEmployment.be  This test is no t yet approved or cleared by the [place] FDA and  has been authorized for detection and/or diagnosis of SARS-CoV-2 by FDA under an Emergency Use Authorization (EUA). This EUA will remain  in effect (meaning this test can be used) for the duration of the COVID-19 declaration under Section 564(b)(1) of the Act, 21 U.S.C.section 360bbb-3(b)(1), unless the authorization is terminated  or revoked sooner.       Influenza A by PCR NEGATIVE NEGATIVE Final   Influenza B by PCR NEGATIVE NEGATIVE Final    Comment: (NOTE) The Xpert Xpress SARS-CoV-2/FLU/RSV plus assay is intended as an aid in the diagnosis of influenza from Nasopharyngeal swab specimens and should not be used as a sole basis for treatment. Nasal washings and aspirates are unacceptable for Xpert Xpress SARS-CoV-2/FLU/RSV testing.  Fact Sheet for Patients: EntrepreneurPulse.com.au  Fact Sheet for Healthcare Providers: IncredibleEmployment.be  This test is not yet approved or cleared by the [place] FDA and has been authorized for detection and/or diagnosis of SARS-CoV-2 by FDA under an Emergency Use Authorization (EUA). This EUA will remain in effect (meaning this test can be used) for the duration of the COVID-19 declaration under Section 564(b)(1) of the Act, 21 U.S.C. section 360bbb-3(b)(1), unless the authorization is terminated or revoked.  Performed at [facility], [address] [place], [place] [address]   Blood Culture (routine x 2)     Status: None (Preliminary result)   Collection Time: 01/03/21  4:34 AM   Specimen: BLOOD  Result Value Ref Range Status   Specimen Description BLOOD RIGHT ANTECUBITAL  Final   Special Requests AEROBIC BOTTLE ONLY Blood Culture adequate volume  Final   Culture   Final    NO GROWTH  3 DAYS Performed at [facility], [address], [place], [place] [address]    Report Status PENDING  Incomplete      Studies: No results found.  Scheduled Meds: . budesonide (PULMICORT) nebulizer solution  0.5 mg Nebulization BID  . citalopram  20 mg Oral Daily  . dextromethorphan-guaiFENesin  1 tablet Oral BID  . doxycycline  100 mg Oral Q12H  . furosemide  20 mg Intravenous BID  . insulin aspart  0-15 Units Subcutaneous TID WC  . insulin aspart  0-5 Units Subcutaneous QHS  . ipratropium  0.5 mg Nebulization BID  . levalbuterol  0.63 mg Nebulization BID  . losartan  100 mg Oral Daily  . methylPREDNISolone (SOLU-MEDROL) injection  60 mg Intravenous Q12H  . metoprolol tartrate  50 mg Oral BID  .  pantoprazole  40 mg Oral Daily  . rivaroxaban  20 mg Oral Q supper    Continuous Infusions:   LOS: 2 days     Hollice Espy, MD [facility]   01/06/2021, 12:52 PM

## 2021-01-07 DIAGNOSIS — I10 Essential (primary) hypertension: Secondary | ICD-10-CM

## 2021-01-07 LAB — BASIC METABOLIC PANEL
Anion gap: 10 (ref 5–15)
BUN: 38 mg/dL — ABNORMAL HIGH (ref 8–23)
CO2: 30 mmol/L (ref 22–32)
Calcium: 9.6 mg/dL (ref 8.9–10.3)
Chloride: 92 mmol/L — ABNORMAL LOW (ref 98–111)
Creatinine, Ser: 1.02 mg/dL (ref 0.61–1.24)
GFR, Estimated: >60 mL/min (ref >60)
Glucose, Bld: 184 mg/dL — ABNORMAL HIGH (ref 70–99)
Potassium: 4.8 mmol/L (ref 3.5–5.1)
Sodium: 132 mmol/L — ABNORMAL LOW (ref 135–145)

## 2021-01-07 LAB — GLUCOSE, CAPILLARY
Glucose-Capillary: 168 mg/dL — ABNORMAL HIGH (ref 70–99)
Glucose-Capillary: 247 mg/dL — ABNORMAL HIGH (ref 70–99)
Glucose-Capillary: 124 mg/dL — ABNORMAL HIGH (ref 70–99)
Glucose-Capillary: 170 mg/dL — ABNORMAL HIGH (ref 70–99)

## 2021-01-07 MED ORDER — HYDRALAZINE HCL 25 MG PO TABS
25.0000 mg | ORAL_TABLET | Freq: Four times a day (QID) | ORAL | Status: DC | PRN
  Administered 2021-01-08: 25 mg via ORAL
  Filled 2021-01-07: qty 1

## 2021-01-07 NOTE — Progress Notes (Signed)
PROGRESS NOTE  Adam Marquez  DOB: 11-Aug-1956  PCP: Jacquelin Hawking, PA-C VPX:[id number]  DOA: 01/03/2021  LOS: 3 days   Chief Complaint  Patient presents with  . Shortness of Breath   Brief narrative: Adam Marquez is a 65 y.o. male with PMH significant for morbid obesity, chronic everyday smoker, undiagnosed COPD, chronic diastolic CHF, atrial fibrillation on Xarelto.   Patient presented to the ED on 1/4 with complaint of progressively worsening shortness of breath, cough and fever.    In the ED, he was noted to have a temperature 100.4, tachycardia, oxygen saturation down to 86% on ambulation.   BNP was elevated to 456.  He was admitted for COPD/CHF exacerbation.    Subjective: Patient was seen and examined this morning. Middle-aged Caucasian male.  Sitting up at the edge of the bed.  Not on supplemental oxygen but still feels tightness of chest.  Symptoms worse on getting up and moving to the bathroom.  Continues to have wheezing on auscultation.  Assessment/Plan: Acute respiratory failure with hypoxia -Secondary to COPD and CHF exacerbation. -Initially required supplemental oxygen.  Currently not requiring oxygen at rest but he still gets winded on ambulation.  Continue to monitor also requirement.  COPD exacerbation Chronic daily smoker -Has been smoking for last several years.  Most likely has underlying COPD characterized by end expiratory wheezing.  Continues to have scattered mild wheezing as well as cough and deep breathing. -Currently on doxycycline, Solu-Medrol 60 mg IV twice daily and nebulizer.  Taper Solu-Medrol to 60 mg daily from tomorrow. -Continue the same for the next 24 hours.  CHF exacerbation Essential hypertension -Echo with EF 50 to 55%. -Current medicines include Lasix 20 mg IV twice daily, losartan 100 mg daily, metoprolol 50 mg twice daily. -Blood pressure elevated to 160s this morning.  Continue to monitor  Elevated  troponin -Troponin peaked at 521.  Likely due to CHF.  No wall motion abnormality in echocardiogram.  History of A. Fib -Currently rate controlled metoprolol.  Hyperglycemia -A1c 5.8.  No history of diabetes. -Blood sugar level currently elevated because of steroids. -Continue sliding-scale insulin with Accu-Cheks. Recent Labs  Lab 01/06/21 1104 01/06/21 1644 01/06/21 2106 01/07/21 0749 01/07/21 1109  GLUCAP 225* 111* 97 168* 247*   Morbid obesity - Body mass index is 41.47 kg/m. Patient has been advised to make an attempt to improve diet and exercise patterns to aid in weight loss.  Incorrectly coded Covid test The patient had a point-of-care Covid antigen test initially documented as positive. This was incorrect documentation, the test was read wrong, and was negative. Follow-up PCR was also negative.  Mobility: Encourage ambulation Code Status:   Code Status: Full Code  Nutritional status: Body mass index is 41.47 kg/m.     Diet Order            Diet Heart Room service appropriate? Yes; Fluid consistency: Thin  Diet effective now                 DVT prophylaxis: SCDs Start: 01/03/21 0803 rivaroxaban (XARELTO) tablet 20 mg   Antimicrobials:  Doxycycline Fluid: None Consultants: None Family Communication:  None at bedside  Status is: Inpatient  Remains inpatient appropriate because: Continues to have chest tightness, wheezing.  Continue diuresis, steroids   Dispo: The patient is from: Home              Anticipated d/c is to: Home  Anticipated d/c date is: Likely tomorrow              Patient currently is not medically stable to d/c.       Infusions:    Scheduled Meds: . budesonide (PULMICORT) nebulizer solution  0.5 mg Nebulization BID  . citalopram  20 mg Oral Daily  . dextromethorphan-guaiFENesin  1 tablet Oral BID  . doxycycline  100 mg Oral Q12H  . furosemide  20 mg Intravenous BID  . insulin aspart  0-15 Units Subcutaneous  TID WC  . insulin aspart  0-5 Units Subcutaneous QHS  . ipratropium  0.5 mg Nebulization BID  . levalbuterol  0.63 mg Nebulization BID  . losartan  100 mg Oral Daily  . methylPREDNISolone (SOLU-MEDROL) injection  60 mg Intravenous Q12H  . metoprolol tartrate  50 mg Oral BID  . pantoprazole  40 mg Oral Daily  . rivaroxaban  20 mg Oral Q supper    Antimicrobials: Anti-infectives (From admission, onward)   Start     Dose/Rate Route Frequency Ordered Stop   01/05/21 1000  azithromycin (ZITHROMAX) tablet 250 mg  Status:  Discontinued        250 mg Oral Daily 01/04/21 1829 01/05/21 0640   01/05/21 1000  doxycycline (VIBRA-TABS) tablet 100 mg        100 mg Oral Every 12 hours 01/05/21 0640     01/04/21 1000  azithromycin (ZITHROMAX) tablet 250 mg  Status:  Discontinued       "Followed by" Linked Group Details   250 mg Oral Daily 01/03/21 0713 01/03/21 0738   01/03/21 0800  meropenem (MERREM) 1 g in sodium chloride 0.9 % 100 mL IVPB  Status:  Discontinued        1 g 200 mL/hr over 30 Minutes Intravenous Every 8 hours 01/03/21 0712 01/04/21 1825   01/03/21 0800  azithromycin (ZITHROMAX) tablet 500 mg  Status:  Discontinued       "Followed by" Linked Group Details   500 mg Oral Daily 01/03/21 0713 01/03/21 0738      PRN meds: acetaminophen **OR** acetaminophen, acetaminophen, levalbuterol, ondansetron **OR** ondansetron (ZOFRAN) IV   Objective: Vitals:   01/06/21 2120 01/07/21 0535  BP: (!) 195/110 (!) 160/73  Pulse: (!) 53 (!) 57  Resp: 18 17  Temp: 97.6 F (36.4 C) 98.4 F (36.9 C)  SpO2: 95% 92%    Intake/Output Summary (Last 24 hours) at 01/07/2021 1307 Last data filed at 01/07/2021 1100 Gross per 24 hour  Intake 720 ml  Output 1100 ml  Net -380 ml   Filed Weights   01/05/21 0456 01/06/21 0521 01/07/21 0613  Weight: (!) 139.9 kg (!) 139.1 kg (!) 138.7 kg   Weight change: -0.4 kg Body mass index is 41.47 kg/m.   Physical Exam: General exam: Pleasant, not in  pain Skin: No rashes, lesions or ulcers. HEENT: Atraumatic, normocephalic, no obvious bleeding Lungs: Mild scattered wheezing bilaterally, cough on deep breathing, CVS: Regular rate and rhythm, no murmur GI/Abd soft, nontender, nondistended, bowel sound present CNS: Alert, awake, oriented x3 Psychiatry: Mood appropriate Extremities: No pedal edema, no calf tenderness  Data Review: I have personally reviewed the laboratory data and studies available.  Recent Labs  Lab 01/03/21 0243 01/04/21 0406 01/05/21 0414  WBC 4.9 7.3 7.4  NEUTROABS 3.9  --   --   HGB 17.6* 16.4 16.7  HCT 53.5* 49.8 51.3  MCV 103.1* 103.1* 103.6*  PLT 107* 114* 125*   Recent Labs  Lab  01/03/21 0243 01/04/21 0406 01/05/21 0414 01/06/21 0442  NA 132* 132* 133* 136  K 4.1 4.3 4.5 4.5  CL 93* 93* 94* 96*  CO2 24 27 29 30   GLUCOSE 120* 158* 182* 166*  BUN 10 25* 28* 33*  CREATININE 1.10 1.08 0.93 0.90  CALCIUM 9.4 9.1 9.2 9.6  MG  --  2.1 2.4  --   PHOS  --  3.9  --   --     F/u labs ordered  Signed, , MD [facility] 01/07/2021

## 2021-01-07 NOTE — Progress Notes (Signed)
[facility] night shift.   The staff reported that the patient's blood pressure was 170/131 mmHg with a heart rate of 104 bpm at 2139 this evening. At 2158, the patient received metoprolol tartrate 50 mg p.o., which he takes twice daily. I have added hydralazine 25 mg p.o. every 6 hours as needed for SBP of more than 149 mmHg aortic DBP more than 99 mmHg.  Sanda Klein, MD.

## 2021-01-08 DIAGNOSIS — I5033 Acute on chronic diastolic (congestive) heart failure: Secondary | ICD-10-CM

## 2021-01-08 LAB — CULTURE, BLOOD (ROUTINE X 2)
Culture: NO GROWTH
Culture: NO GROWTH
Special Requests: ADEQUATE
Special Requests: ADEQUATE

## 2021-01-08 LAB — GLUCOSE, CAPILLARY
Glucose-Capillary: 153 mg/dL — ABNORMAL HIGH (ref 70–99)
Glucose-Capillary: 186 mg/dL — ABNORMAL HIGH (ref 70–99)

## 2021-01-08 MED ORDER — PREDNISONE 20 MG PO TABS: 10.0000 mg | ORAL_TABLET | Freq: Every day | ORAL | Status: DC

## 2021-01-08 MED ORDER — PREDNISONE 20 MG PO TABS
ORAL_TABLET | ORAL | 0 refills | Status: DC
Start: 2021-01-08 — End: 2021-03-28

## 2021-01-08 MED ORDER — FUROSEMIDE 40 MG PO TABS: 60.0000 mg | ORAL_TABLET | Freq: Every day | ORAL | 3 refills | Status: DC

## 2021-01-08 MED ORDER — PREDNISONE 20 MG PO TABS
60.0000 mg | ORAL_TABLET | Freq: Every day | ORAL | Status: DC
  Filled 2021-01-08: qty 3

## 2021-01-08 MED ORDER — PANTOPRAZOLE SODIUM 40 MG PO TBEC: 40.0000 mg | DELAYED_RELEASE_TABLET | Freq: Every day | ORAL | 1 refills | Status: DC

## 2021-01-08 MED ORDER — PREDNISONE 20 MG PO TABS: 40.0000 mg | ORAL_TABLET | Freq: Every day | ORAL | Status: DC

## 2021-01-08 MED ORDER — PREDNISONE 20 MG PO TABS: 20.0000 mg | ORAL_TABLET | Freq: Every day | ORAL | Status: DC

## 2021-01-08 MED ORDER — DM-GUAIFENESIN ER 30-600 MG PO TB12: 1.0000 | ORAL_TABLET | Freq: Two times a day (BID) | ORAL | 0 refills | Status: DC

## 2021-01-08 NOTE — Progress Notes (Signed)
Patient states understanding of discharge instructions.  

## 2021-01-08 NOTE — Discharge Summary (Signed)
Physician Discharge Summary  Adam Marquez WUJ:[id number] DOB: Sep 07, 1956 DOA: 01/03/2021  PCP: [facility], PA-C  Admit date: 01/03/2021 Discharge date: 01/08/2021  Time spent: 35 minutes  Recommendations for Outpatient Follow-up:  1. Repeat basic metabolic panel to follow across renal function 2. Close monitoring of patient's CBGs with further adjustment to hypoglycemic regimen as required. 3. Reassess patient volume status symptomatology was evaluated therapy as needed 4. Continue assisting him with weight loss and tobacco cessation.   Discharge Diagnoses:  Acute respiratory failure with hypoxia COPD exacerbation with bronchitis Acute on chronic diastolic heart failure Morbid obesity Tobacco abuse History of atrial fibrillation Elevated troponin Hyperglycemia/prediabetes  Discharge Condition: Stable and improved.  Discharged home with instruction to follow-up with PCP in 10 days.  Patient will also follow-up with cardiology at least 2 to 3 weeks.  CODE STATUS: Full code.  Diet recommendation: Low calorie, modified carbohydrate and heart healthy/low-sodium diet instructed.  Filed Weights   01/06/21 0521 01/07/21 0613 01/08/21 0546  Weight: (!) 139.1 kg (!) 138.7 kg 136 kg    History of present illness:  As per H&P written by Dr. Thomes Dinning on 01/04/2020 Adam Marquez is a 65 y.o. male with medical history significant for hypertension, CHF, A. fib with RVR, COPD (not on home oxygen), tobacco use and morbid obesity who presents to the emergency department accompanied by brother due to 2-day onset of increasing shortness of breath.  Shortness of breath worsens with lying down in bed and ambulation, this was associated with cough with production of clear sputum, fever of 101F and leg swelling bilaterally.  Patient used to home inhaler with minimal relief, he states that he quit smoking about 6 months ago and that he took [place] Covid vaccine x2.  He denies abdominal pain,  nausea, vomiting or having any sick contact.  ED Course:  In the emergency department, he was noted to be febrile with a temperature of 100.58F, he was tachypneic and tachycardic.  BP was 162/101.  Work-up in the ED showed macrocytic anemia, thrombocytopenia,, troponin I 275 >424, BNP 456, respiratory panel for influenza A, B and SARS coronavirus 2 was negative (of note, this was initially mistakenly recorded to be positive). Chest x-ray showed mild bronchial wall thickening which could reflect reactive airway disease or viral pneumonitis. No lobar pneumonia. Several rounds of breathing treatment was provided, Decadron was given.  Hospitalist was asked to admit patient for further evaluation and management.  Hospital Course:  Acute respiratory failure with hypoxia -Present on admission -Secondary to COPD exacerbation and acute on chronic diastolic heart failure -Treated appropriately with IV diuresis, steroids, oral antibiotics and nebulizer management. -At discharge patient was no requiring oxygen supplementation -Daily Lasix dosage has been adjusted for better control of his symptoms and instruction provided to follow low-sodium diet and to check his weight on daily basis. -Patient complete oral antibiotic while inpatient and at discharge.  Instructions were still explained to him -Resume the use of Advair and as needed albuterol. -Follow-up with PCP in 10 days -Outpatient follow-up with cardiology service in 2-3 weeks.  COPD exacerbation -Treatment as mentioned above -Patient instructed to stop smoking -Outpatient follow-up with pulmonology service recommended.  Acute on chronic diastolic heart failure -Patient instructed to be compliant with medication and low-sodium diet -Advised to follow daily weights -Instructed to maintain adequate hydration -Continue the use of metoprolol and adjusted dose of diuretics as instructed. -Outpatient follow-up with cardiology service in 2-3  weeks.  Elevated troponin -Troponin peaked at 521 -  Demand ischemia suspected -Normal motion normalities appreciated on 2D echo -The patient denies chest pain at discharge.  Hypoglycemia -No prior history of diabetes -A1c 5.8; meeting prediabetes range -Elevated blood sugar most likely associated to the use of the steroids -Patient advised to lose weight and to follow modified carbohydrate diet.  History of atrial fibrillation -Rate control -Continue the use of metoprolol and continue the use of Xarelto for secondary prevention.  Morbid obesity -Body mass index is 40.66 kg/m. -Low calorie diet, portion control and increase physical activity discussed with patient.  Procedures: 2D echo: Preserved ejection fraction, no wall motion normalities; no significant valvular disease.  Consultations:  Cardiology service  Discharge Exam: Vitals:   01/08/21 0753 01/08/21 0755  BP:    Pulse:    Resp:    Temp:    SpO2: 95% 95%    General: Afebrile, no chest pain, no nausea, no vomiting.  Patient reports no chest pain no palpitations and feeling ready to go home.  Not requiring oxygen supplementation at discharge and is speaking in full sentences. Cardiovascular: Rate controlled, no rubs, no gallops, no JVD. Respiratory: Improved abdominal bilaterally; no wheezing, no flank crackles; no using accessory muscles. Abdomen: Obese, soft, nontender, distended, positive bowel sounds Extremities: Trace edema bilaterally; no cyanosis or clubbing.  Discharge Instructions   Discharge Instructions    Diet - low sodium heart healthy   Complete by: As directed    Discharge instructions   Complete by: As directed    Take medications as prescribed Check your weight on daily basis Maintain adequate hydration Follow low-sodium diet (less than 2--2.3 g daily) Arrange follow-up with PCP in 10 days   Increase activity slowly   Complete by: As directed      Allergies as of 01/08/2021       Reactions   Keflex [cephalexin] Hives, Nausea And Vomiting      Medication List    TAKE these medications   acetaminophen 500 MG tablet Commonly known as: TYLENOL Take 500 mg by mouth every 6 (six) hours as needed.   albuterol 108 (90 Base) MCG/ACT inhaler Commonly known as: Proventil HFA INHALE 2 PUFFS BY MOUTH EVERY 4 HOURS AS NEEDED FOR COUGHING, WHEEZING, OR SHORTNESS OF BREATH   citalopram 20 MG tablet Commonly known as: CELEXA Take 1 tablet (20 mg total) by mouth daily.   Clear Eyes All Seasons 5-6 MG/ML Soln Generic drug: Polyvinyl Alcohol-Povidone Apply 1 drop to eye daily as needed (for allergies).   dextromethorphan-guaiFENesin 30-600 MG 12hr tablet Commonly known as: MUCINEX DM Take 1 tablet by mouth 2 (two) times daily.   fluticasone 50 MCG/ACT nasal spray Commonly known as: FLONASE Place 2 sprays into both nostrils daily.   Fluticasone-Salmeterol 100-50 MCG/DOSE Aepb Commonly known as: Advair Diskus Inhale 1 puff into the lungs in the morning and at bedtime.   furosemide 40 MG tablet Commonly known as: LASIX Take 1.5 tablets (60 mg total) by mouth daily. What changed: how much to take   losartan 100 MG tablet Commonly known as: COZAAR Take 1 tablet (100 mg total) by mouth daily.   metoprolol tartrate 50 MG tablet Commonly known as: LOPRESSOR Take 1 tablet (50 mg total) by mouth 2 (two) times daily.   pantoprazole 40 MG tablet Commonly known as: PROTONIX Take 1 tablet (40 mg total) by mouth daily. Start taking on: January 09, 2021   potassium chloride 10 MEQ tablet Commonly known as: KLOR-CON Take 1 tablet (10 mEq total) by mouth 2 (  two) times daily.   predniSONE 20 MG tablet Commonly known as: Deltasone Take 3 tablets by mouth daily x2 days; then 2 tablets by mouth daily x2 days; then 1 tablet by mouth daily x3 days; then half tablet by mouth daily x3 days and stop prednisone.   rivaroxaban 20 MG Tabs tablet Commonly known as: XARELTO Take 1  tablet (20 mg total) by mouth daily with supper.      Allergies  Allergen Reactions  . Keflex [Cephalexin] Hives and Nausea And Vomiting    Follow-up Information    Jacqu[facility], PA-C. Schedule an appointment as soon as possible for a visit in 10 day(s).   Specialty: Physician Assistant Contact information: [address] [place] [address] [phone]        Antoine Poche, MD .   Specialty: Cardiology Contact information: [address] [place] [address] [phone]                The results of significant diagnostics from this hospitalization (including imaging, microbiology, ancillary and laboratory) are listed below for reference.    Significant Diagnostic Studies: DG Chest Port 1 View  Result Date: 01/03/2021 CLINICAL DATA:  Short of breath for 3 days, fever and cough EXAM: PORTABLE CHEST 1 VIEW COMPARISON:  10/22/2019 FINDINGS: 2 frontal views of the chest demonstrate a stable cardiac silhouette. No acute airspace disease, effusion, or pneumothorax. Mild peribronchial cuffing. No acute bony abnormalities. IMPRESSION: 1. Mild bronchial wall thickening which could reflect reactive airway disease or viral pneumonitis. No lobar pneumonia. Electronically Signed   By: Sharlet Salina M.D.   On: 01/03/2021 02:37   ECHOCARDIOGRAM COMPLETE  Result Date: 01/03/2021    ECHOCARDIOGRAM REPORT   Patient Name:   Adam Marquez Date of Exam: 01/03/2021 Medical Rec #:  [id number]            Height:       72.0 in Accession #:    [id number]           Weight:       306.9 lb Date of Birth:  [birth year]            BSA:          2.556 m Patient Age:    64 years             BP:           125/72 mmHg Patient Gender: M                    HR:           92 bpm. [facility] Procedure: 2D Echo, Cardiac Doppler and Color Doppler Indications:    CHF-Acute Diastolic I50.31  History:        Patient has prior history of Echocardiogram examinations, most                  recent 10/29/2019. COPD, Arrythmias:Atrial Fibrillation; Risk                 Factors:Hypertension, Tobacco use, Dyslipidemia and Diabetes.                 Obesity, Acute respiratory failure with hypoxia.  Sonographer:    Celesta Gentile RCS Referring Phys: [id number] OLADAPO ADEFESO IMPRESSIONS  1. Left ventricular ejection fraction, by estimation, is 50 to 55%. The left ventricle has low normal function. The left ventricle has no regional wall motion abnormalities. There is  mild left ventricular hypertrophy. Left ventricular diastolic parameters are indeterminate.  2. Right ventricular systolic function is low normal. The right ventricular size is mildly enlarged. Tricuspid regurgitation signal is inadequate for assessing PA pressure.  3. The mitral valve is grossly normal. Trivial mitral valve regurgitation.  4. The aortic valve is tricuspid. There is mild calcification of the aortic valve. Aortic valve regurgitation is not visualized.  5. The inferior vena cava is normal in size with greater than 50% respiratory variability, suggesting right atrial pressure of 3 mmHg. FINDINGS  Left Ventricle: Left ventricular ejection fraction, by estimation, is 50 to 55%. The left ventricle has low normal function. The left ventricle has no regional wall motion abnormalities. The left ventricular internal cavity size was normal in size. There is mild left ventricular hypertrophy. Left ventricular diastolic parameters are indeterminate. Right Ventricle: The right ventricular size is mildly enlarged. No increase in right ventricular wall thickness. Right ventricular systolic function is low normal. Tricuspid regurgitation signal is inadequate for assessing PA pressure. Left Atrium: Left atrial size was normal in size. Right Atrium: Right atrial size was normal in size. Pericardium: There is no evidence of pericardial effusion. Mitral Valve: The mitral valve is grossly normal. Mild mitral annular calcification. Trivial mitral valve  regurgitation. Tricuspid Valve: The tricuspid valve is grossly normal. Tricuspid valve regurgitation is trivial. Aortic Valve: The aortic valve is tricuspid. There is mild calcification of the aortic valve. There is mild aortic valve annular calcification. Aortic valve regurgitation is not visualized. Pulmonic Valve: The pulmonic valve was grossly normal. Pulmonic valve regurgitation is trivial. Aorta: The aortic root is normal in size and structure. Venous: The inferior vena cava is normal in size with greater than 50% respiratory variability, suggesting right atrial pressure of 3 mmHg. IAS/Shunts: No atrial level shunt detected by color flow Doppler.  LEFT VENTRICLE PLAX 2D LVIDd:         5.20 cm  Diastology LVIDs:         4.10 cm  LV e' medial:    3.48 cm/s LV PW:         1.30 cm  LV E/e' medial:  18.7 LV IVS:        1.20 cm  LV e' lateral:   3.48 cm/s LVOT diam:     2.00 cm  LV E/e' lateral: 18.7 LV SV:         57 LV SV Index:   22 LVOT Area:     3.14 cm  RIGHT VENTRICLE RV S prime:     11.40 cm/s TAPSE (M-mode): 2.5 cm LEFT ATRIUM             Index       RIGHT ATRIUM           Index LA diam:        4.60 cm 1.80 cm/m  RA Area:     23.50 cm LA Vol (A2C):   65.2 ml 25.51 ml/m RA Volume:   78.40 ml  30.68 ml/m LA Vol (A4C):   43.4 ml 16.98 ml/m LA Biplane Vol: 53.5 ml 20.93 ml/m  AORTIC VALVE LVOT Vmax:   101.35 cm/s LVOT Vmean:  66.050 cm/s LVOT VTI:    0.182 m  AORTA Ao Root diam: 3.20 cm MITRAL VALVE MV Area (PHT): 3.17 cm    SHUNTS MV Decel Time: 239 msec    Systemic VTI:  0.18 m MV E velocity: 65.10 cm/s  Systemic Diam: 2.00 cm MV A velocity: 75.40 cm/s  MV E/A ratio:  0.86 Nona DellSamuel Mcdowell MD Electronically signed by Nona DellSamuel Mcdowell MD Signature Date/Time: 01/03/2021/11:41:09 AM    Final     Microbiology: Recent Results (from the past 240 hour(s))  Blood Culture (routine x 2)     Status: None   Collection Time: 01/03/21  2:43 AM   Specimen: BLOOD  Result Value Ref Range Status   Specimen  Description BLOOD RIGHT ANTECUBITAL  Final   Special Requests   Final    BOTTLES DRAWN AEROBIC AND ANAEROBIC Blood Culture adequate volume   Culture   Final    NO GROWTH 5 DAYS Performed at [facility], [address] [place], [place] [address]    Report Status 01/08/2021 FINAL  Final  Resp Panel by RT-PCR (Flu A&B, Covid) Nasopharyngeal Swab     Status: None   Collection Time: 01/03/21  2:47 AM   Specimen: Nasopharyngeal Swab; Nasopharyngeal(NP) swabs in vial transport medium  Result Value Ref Range Status   SARS Coronavirus 2 by RT PCR NEGATIVE NEGATIVE Final    Comment: (NOTE) SARS-CoV-2 target nucleic acids are NOT DETECTED.  The SARS-CoV-2 RNA is generally detectable in upper respiratory specimens during the acute phase of infection. The lowest concentration of SARS-CoV-2 viral copies this assay can detect is 138 copies/mL. A negative result does not preclude SARS-Cov-2 infection and should not be used as the sole basis for treatment or other patient management decisions. A negative result may occur with  improper specimen collection/handling, submission of specimen other than nasopharyngeal swab, presence of viral mutation(s) within the areas targeted by this assay, and inadequate number of viral copies(<138 copies/mL). A negative result must be combined with clinical observations, patient history, and epidemiological information. The expected result is Negative.  Fact Sheet for Patients:  BloggerCourse.comhttps://www.fda.gov/media/152166/download  Fact Sheet for Healthcare Providers:  SeriousBroker.ithttps://www.fda.gov/media/152162/download  This test is no t yet approved or cleared by the [place] FDA and  has been authorized for detection and/or diagnosis of SARS-CoV-2 by FDA under an Emergency Use Authorization (EUA). This EUA will remain  in effect (meaning this test can be used) for the duration of the COVID-19 declaration under Section 564(b)(1) of the Act, 21 U.S.C.section 360bbb-3(b)(1),  unless the authorization is terminated  or revoked sooner.       Influenza A by PCR NEGATIVE NEGATIVE Final   Influenza B by PCR NEGATIVE NEGATIVE Final    Comment: (NOTE) The Xpert Xpress SARS-CoV-2/FLU/RSV plus assay is intended as an aid in the diagnosis of influenza from Nasopharyngeal swab specimens and should not be used as a sole basis for treatment. Nasal washings and aspirates are unacceptable for Xpert Xpress SARS-CoV-2/FLU/RSV testing.  Fact Sheet for Patients: BloggerCourse.comhttps://www.fda.gov/media/152166/download  Fact Sheet for Healthcare Providers: SeriousBroker.ithttps://www.fda.gov/media/152162/download  This test is not yet approved or cleared by the [place] FDA and has been authorized for detection and/or diagnosis of SARS-CoV-2 by FDA under an Emergency Use Authorization (EUA). This EUA will remain in effect (meaning this test can be used) for the duration of the COVID-19 declaration under Section 564(b)(1) of the Act, 21 U.S.C. section 360bbb-3(b)(1), unless the authorization is terminated or revoked.  Performed at [facility], [address] [place], [place] [address]   Blood Culture (routine x 2)     Status: None   Collection Time: 01/03/21  4:34 AM   Specimen: BLOOD  Result Value Ref Range Status   Specimen Description BLOOD RIGHT ANTECUBITAL  Final   Special Requests AEROBIC BOTTLE ONLY Blood Culture adequate volume  Final   Culture   Final    NO GROWTH 5 DAYS Performed at [facility], [address] [place], [place] [address]    Report Status 01/08/2021 FINAL  Final     Labs: Basic Metabolic Panel: Recent Labs  Lab 01/03/21 0243 01/04/21 0406 01/05/21 0414 01/06/21 0442 01/07/21 0500  NA 132* 132* 133* 136 132*  K 4.1 4.3 4.5 4.5 4.8  CL 93* 93* 94* 96* 92*  CO2 24 27 29 30 30   GLUCOSE 120* 158* 182* 166* 184*  BUN 10 25* 28* 33* 38*  CREATININE 1.10 1.08 0.93 0.90 1.02  CALCIUM 9.4 9.1 9.2 9.6 9.6  MG  --  2.1 2.4  --   --   PHOS  --  3.9  --   --    --    Liver Function Tests: Recent Labs  Lab 01/03/21 0243 01/04/21 0406  AST 28 29  ALT 16 16  ALKPHOS 74 60  BILITOT 1.2 0.8  PROT 8.0 7.7  ALBUMIN 4.2 3.7   CBC: Recent Labs  Lab 01/03/21 0243 01/04/21 0406 01/05/21 0414  WBC 4.9 7.3 7.4  NEUTROABS 3.9  --   --   HGB 17.6* 16.4 16.7  HCT 53.5* 49.8 51.3  MCV 103.1* 103.1* 103.6*  PLT 107* 114* 125*    BNP (last 3 results) Recent Labs    01/03/21 0243  BNP 456.0*   CBG: Recent Labs  Lab 01/07/21 1109 01/07/21 1645 01/07/21 2057 01/08/21 0724 01/08/21 1120  GLUCAP 247* 124* 170* 153* 186*    Signed:  Vassie Lollarlos Randol Zumstein MD.  [facility] 01/08/2021, 12:47 PM

## 2021-01-08 NOTE — Progress Notes (Signed)
Patient ambulated in the hall without oxygen and maintained an oxygen saturation of 94%, patient did not c/o SOB or dizziness

## 2021-01-08 NOTE — TOC Transition Note (Signed)
Transition of Care [facility]) - CM/SW Discharge Note   Patient Details  Name: Adam Marquez MRN: [id number] Date of Birth: September 05, 1956  [facility] CM/SW Contact:  Barry Brunner, LCSW Phone Number: 01/08/2021, 12:59 PM   Clinical Narrative:    CSW contacted [facility] to notify them of patient's need to discharge home with medication to cover patient needs post discharge. Garret with pharmacy agreeable to provide discharge medications. TOC signing off.    Final next level of care: Home/Self Care Barriers to Discharge: Barriers Resolved   Patient Goals and CMS Choice Patient states their goals for this hospitalization and ongoing recovery are:: Return home CMS Medicare.gov Compare Post Acute Care list provided to:: Patient Choice offered to / list presented to : Patient  Discharge Placement                    Patient and family notified of of transfer: 01/08/21  Discharge Plan and Services In-house Referral: NA Discharge Planning Services: NA Post Acute Care Choice: NA          DME Arranged: N/A DME Agency: NA       HH Arranged: NA HH Agency: NA        Social Determinants of Health (SDOH) Interventions     Readmission Risk Interventions Readmission Risk Prevention Plan 10/30/2019 10/23/2019  Post Dischage Appt Complete Complete  Medication Screening Complete Complete  Transportation Screening Complete Complete

## 2021-01-17 ENCOUNTER — Ambulatory Visit: Payer: Self-pay | Admitting: Cardiology

## 2021-02-03 ENCOUNTER — Encounter (HOSPITAL_COMMUNITY): Payer: Self-pay

## 2021-02-03 ENCOUNTER — Other Ambulatory Visit: Payer: Self-pay

## 2021-02-03 ENCOUNTER — Emergency Department (HOSPITAL_COMMUNITY)
Admission: EM | Admit: 2021-02-03 | Discharge: 2021-02-03 | Disposition: A | Discharge status: Home / Self Care | Payer: Self-pay | Attending: Emergency Medicine | Admitting: Emergency Medicine

## 2021-02-03 DIAGNOSIS — R2243 Localized swelling, mass and lump, lower limb, bilateral: Secondary | ICD-10-CM | POA: Insufficient documentation

## 2021-02-03 DIAGNOSIS — I509 Heart failure, unspecified: Secondary | ICD-10-CM | POA: Insufficient documentation

## 2021-02-03 DIAGNOSIS — Z5321 Procedure and treatment not carried out due to patient leaving prior to being seen by health care provider: Secondary | ICD-10-CM | POA: Insufficient documentation

## 2021-02-03 NOTE — ED Triage Notes (Signed)
Pt to er via ems, pt states that he was here last year for some CHF, states that at the time he had some pain and swelling in his ankles.  Pt states that he is here today for swelling in his legs and ankles, pt states that they also feel like they are burning, pt has redness and swelling to his ankles and lower legs.  Pt states that the swelling started yesterday.

## 2021-02-09 ENCOUNTER — Other Ambulatory Visit: Payer: Self-pay | Admitting: Physician Assistant

## 2021-03-17 ENCOUNTER — Other Ambulatory Visit: Payer: Self-pay | Admitting: Physician Assistant

## 2021-03-28 ENCOUNTER — Ambulatory Visit
Admission: EM | Admit: 2021-03-28 | Discharge: 2021-03-28 | Disposition: A | Discharge status: Home / Self Care | Payer: Self-pay | Attending: Emergency Medicine | Admitting: Emergency Medicine

## 2021-03-28 ENCOUNTER — Encounter: Payer: Self-pay | Admitting: Emergency Medicine

## 2021-03-28 DIAGNOSIS — H9201 Otalgia, right ear: Secondary | ICD-10-CM

## 2021-03-28 DIAGNOSIS — H66001 Acute suppurative otitis media without spontaneous rupture of ear drum, right ear: Secondary | ICD-10-CM

## 2021-03-28 MED ORDER — AZITHROMYCIN 250 MG PO TABS: 250.0000 mg | ORAL_TABLET | Freq: Every day | ORAL | 0 refills | Status: DC

## 2021-03-28 NOTE — ED Triage Notes (Signed)
Right ear pain started yesterday

## 2021-03-28 NOTE — Discharge Instructions (Signed)
Rest and drink plenty of fluids Prescribed azithromycin Take medications as directed and to completion Continue to use OTC ibuprofen and/ or tylenol as needed for pain control Follow up with PCP if symptoms persists Return here or go to the ER if you have any new or worsening symptoms  

## 2021-03-28 NOTE — ED Provider Notes (Signed)
[facility]   [id number] 03/28/21 Arrival Time: 1159  CC:EAR PAIN  SUBJECTIVE: History from: patient.  Adam Marquez is a 65 y.o. male who presents with of RT ear pain x 1 day.  Denies a precipitating event, such as swimming or wearing ear plugs.  Patient states the pain is constant and achy in character.  Patient has tried OTC medication without relief.  Symptoms are made worse with lying down.  Reports similar symptoms in the past.    Denies fever, chills, fatigue, sinus pain, rhinorrhea, ear discharge, sore throat, SOB, wheezing, chest pain, nausea, changes in bowel or bladder habits.    ROS: As per HPI.  All other pertinent ROS negative.     Past Medical History:  Diagnosis Date  . Atrial fibrillation (HCC)    a. s/p DCCV in 10/2019  . COPD (chronic obstructive pulmonary disease) (HCC)   . Eczema   . Hyperlipidemia   . Hypertension    Past Surgical History:  Procedure Laterality Date  . ANAL FISSURE REPAIR  1980  . CARDIOVERSION N/A 10/29/2019   Procedure: CARDIOVERSION WITH PROPOFOL;  Surgeon: Laqueta Linden, MD;  Location: [facility];  Service: Cardiovascular;  Laterality: N/A;  . MOUTH SURGERY  1990  . TEE WITHOUT CARDIOVERSION N/A 10/29/2019   Procedure: TRANSESOPHAGEAL ECHOCARDIOGRAM (TEE) WITH PROPOFOL;  Surgeon: Laqueta Linden, MD;  Location: [facility];  Service: Cardiovascular;  Laterality: N/A;   Allergies  Allergen Reactions  . Keflex [Cephalexin] Hives and Nausea And Vomiting   No current facility-administered medications on file prior to encounter.   Current Outpatient Medications on File Prior to Encounter  Medication Sig Dispense Refill  . acetaminophen (TYLENOL) 500 MG tablet Take 500 mg by mouth every 6 (six) hours as needed.    [name] Kitchen albuterol (PROVENTIL HFA) 108 (90 Base) MCG/ACT inhaler INHALE 2 PUFFS BY MOUTH EVERY 4 HOURS AS NEEDED FOR COUGHING, WHEEZING, OR SHORTNESS OF BREATH 3 each 0  . Fluticasone-Salmeterol (ADVAIR DISKUS)  100-50 MCG/DOSE AEPB Inhale 1 puff into the lungs in the morning and at bedtime. 60 each 0  . furosemide (LASIX) 40 MG tablet Take 1.5 tablets (60 mg total) by mouth daily. 90 tablet 3  . metoprolol tartrate (LOPRESSOR) 50 MG tablet Take 1 tablet (50 mg total) by mouth 2 (two) times daily. 180 tablet 0  . Polyvinyl Alcohol-Povidone (CLEAR EYES ALL SEASONS) 5-6 MG/ML SOLN Apply 1 drop to eye daily as needed (for allergies).    . potassium chloride (KLOR-CON) 10 MEQ tablet Take 1 tablet (10 mEq total) by mouth 2 (two) times daily. 180 tablet 0  . rivaroxaban (XARELTO) 20 MG TABS tablet Take 1 tablet (20 mg total) by mouth daily with supper. 90 tablet 1  . [DISCONTINUED] citalopram (CELEXA) 20 MG tablet Take 1 tablet (20 mg total) by mouth daily. 90 tablet 0  . [DISCONTINUED] fluticasone (FLONASE) 50 MCG/ACT nasal spray Place 2 sprays into both nostrils daily. 16 g 0  . [DISCONTINUED] losartan (COZAAR) 100 MG tablet Take 1 tablet (100 mg total) by mouth daily. 90 tablet 0  . [DISCONTINUED] pantoprazole (PROTONIX) 40 MG tablet Take 1 tablet (40 mg total) by mouth daily. 30 tablet 1   Social History   Socioeconomic History  . Marital status: Single    Spouse name: Not on file  . Number of children: Not on file  . Years of education: Not on file  . Highest education level: Not on file  Occupational History  . Not  on file  Tobacco Use  . Smoking status: Current Some Day Smoker    Packs/day: 0.25    Years: 42.00    Pack years: 10.50    Types: Cigarettes  . Smokeless tobacco: Never Used  . Tobacco comment: 3 cigs daily - 11-10-2020  Vaping Use  . Vaping Use: Never used  Substance and Sexual Activity  . Alcohol use: Yes    Comment: a 12 pack of beer a week.  . Drug use: Never  . Sexual activity: Not on file  Other Topics Concern  . Not on file  Social History Narrative  . Not on file   Social Determinants of Health   Financial Resource Strain: Not on file  Food Insecurity: Not on  file  Transportation Needs: Not on file  Physical Activity: Not on file  Stress: Not on file  Social Connections: Not on file  Intimate Partner Violence: Not on file   Family History  Problem Relation Age of Onset  . Lung cancer Mother   . Heart disease Father   . Hypertension Father   . Diabetes Brother   . Cancer Daughter     OBJECTIVE:  Vitals:   03/28/21 1213 03/28/21 1215  BP: (!) 171/101 (!) 155/96  Pulse: 88   Resp: 17   Temp: 97.6 F (36.4 C)   TempSrc: Tympanic   SpO2: 98%     General appearance: alert; mildly fatigued appearing, nontoxic; speaking in full sentences and tolerating own secretions HEENT: NCAT; Ears: EACs with some cerumen, RT TM erythematous, LT TM pearly gray; Eyes: PERRL.  EOM grossly intact.Nose: nares patent without rhinorrhea, Throat: oropharynx clear, tonsils non erythematous or enlarged, uvula midline  Neck: supple without LAD Lungs: unlabored respirations, symmetrical air entry; cough: absent; no respiratory distress; CTAB Heart: regular rate and rhythm.  Skin: warm and dry Psychological: alert and cooperative; normal mood and affect    ASSESSMENT & PLAN:  1. Right ear pain   2. Non-recurrent acute suppurative otitis media of right ear without spontaneous rupture of tympanic membrane     Meds ordered this encounter  Medications  . azithromycin (ZITHROMAX) 250 MG tablet    Sig: Take 1 tablet (250 mg total) by mouth daily. Take first 2 tablets together, then 1 every day until finished.    Dispense:  6 tablet    Refill:  0    Order Specific Question:   Supervising Provider    Answer:   Eustace Moore [[id number]]    Rest and drink plenty of fluids Prescribed azithromycin Take medications as directed and to completion Continue to use OTC ibuprofen and/ or tylenol as needed for pain control Follow up with PCP if symptoms persists Return here or go to the ER if you have any new or worsening symptoms   Reviewed expectations re:  course of current medical issues. Questions answered. Outlined signs and symptoms indicating need for more acute intervention. Patient verbalized understanding. After Visit Summary given.         Rennis Harding, PA-C 03/28/21 1244

## 2021-04-04 IMAGING — DX DG CHEST 1V PORT
1 series · 2 of 2 positions shown · non-contrast
Comparison: 10/22/2019

CLINICAL DATA: Short of breath for 3 days, fever and cough

EXAM:
PORTABLE CHEST 1 VIEW

[Series 2: chest ap grid · 0.14mm/px · 2 of 2 slices shown]
[im 1/2]
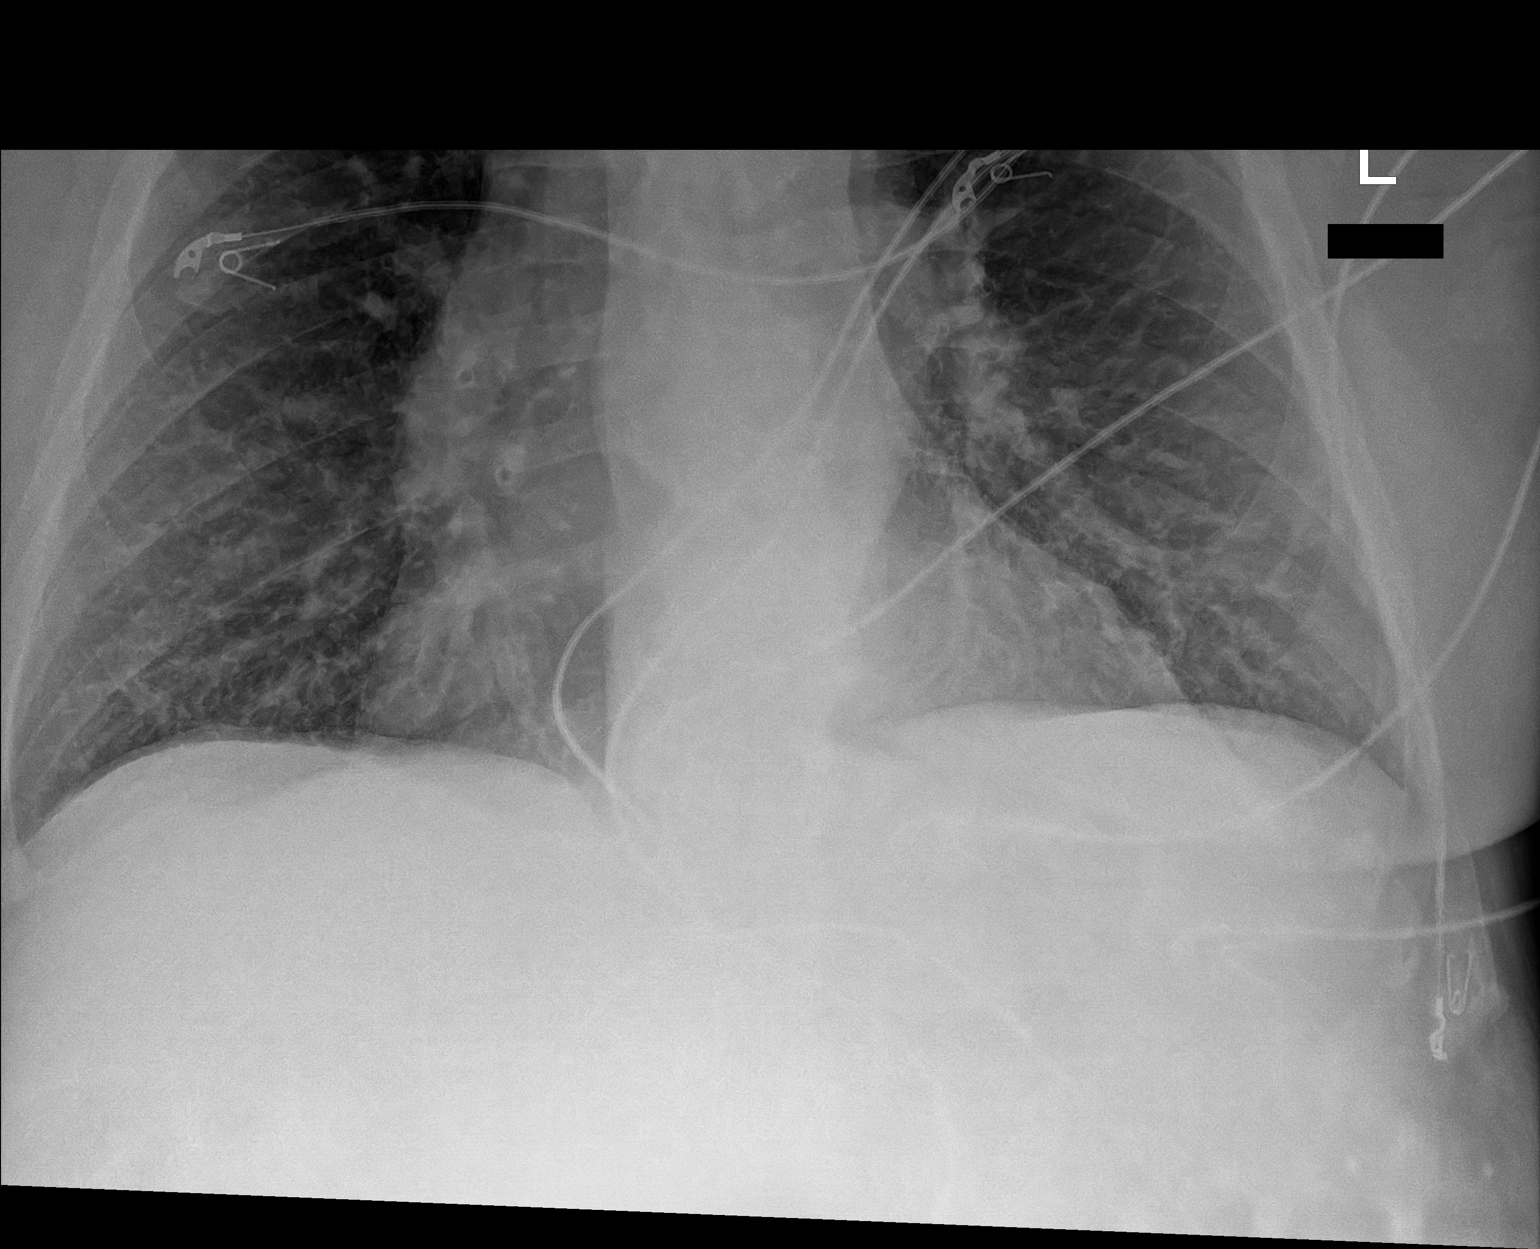
[im 2/2]
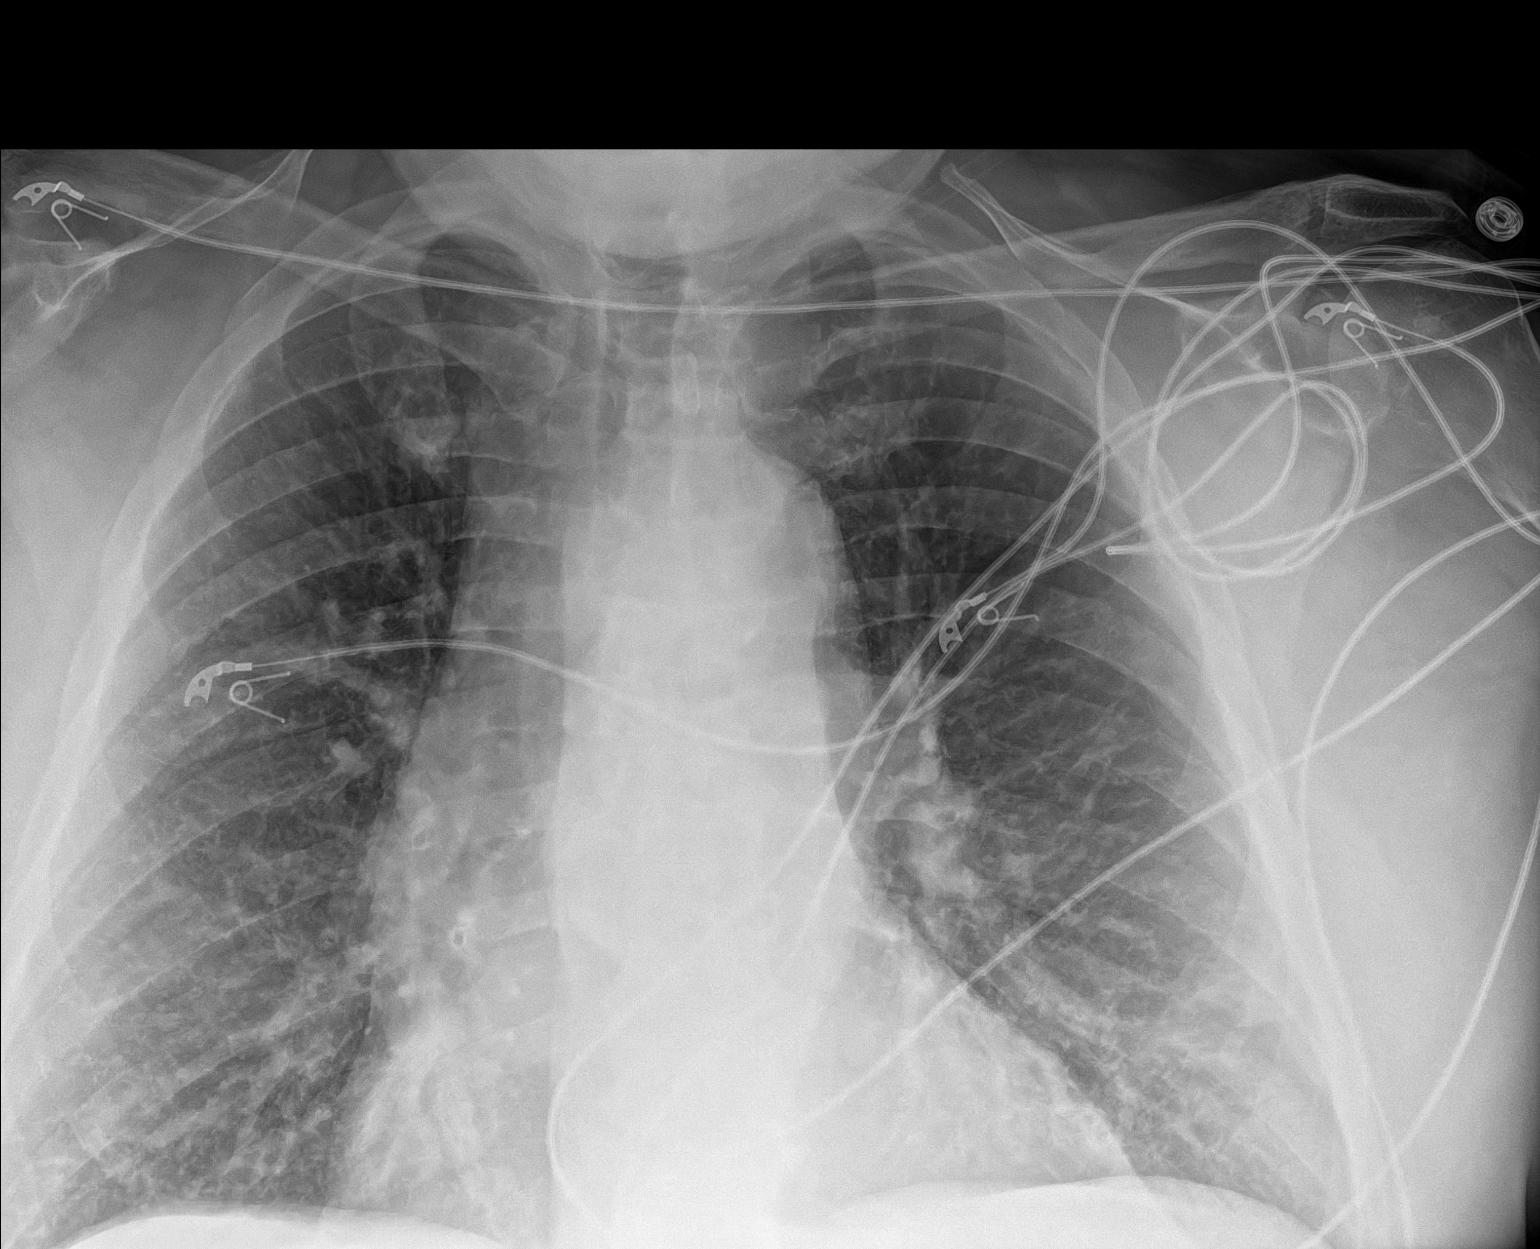

[2 of 2 positions shown; findings below may reference images not displayed]

FINDINGS: 2 frontal views of the chest demonstrate a stable cardiac
silhouette. No acute airspace disease, effusion, or pneumothorax.
Mild peribronchial cuffing. No acute bony abnormalities.
IMPRESSION: 1. Mild bronchial wall thickening which could reflect reactive
airway disease or viral pneumonitis. No lobar pneumonia.

## 2021-04-26 ENCOUNTER — Other Ambulatory Visit: Payer: Self-pay

## 2021-04-26 ENCOUNTER — Ambulatory Visit: Payer: Self-pay | Admitting: Physician Assistant

## 2021-04-26 ENCOUNTER — Encounter: Payer: Self-pay | Admitting: Physician Assistant

## 2021-04-26 VITALS — BP 160/86 | HR 88 | Temp 96.5°F | Wt 327.0 lb

## 2021-04-26 DIAGNOSIS — J449 Chronic obstructive pulmonary disease, unspecified: Secondary | ICD-10-CM

## 2021-04-26 DIAGNOSIS — I482 Chronic atrial fibrillation, unspecified: Secondary | ICD-10-CM

## 2021-04-26 DIAGNOSIS — Z7901 Long term (current) use of anticoagulants: Secondary | ICD-10-CM

## 2021-04-26 DIAGNOSIS — I5032 Chronic diastolic (congestive) heart failure: Secondary | ICD-10-CM

## 2021-04-26 DIAGNOSIS — F17200 Nicotine dependence, unspecified, uncomplicated: Secondary | ICD-10-CM

## 2021-04-26 DIAGNOSIS — M25562 Pain in left knee: Secondary | ICD-10-CM

## 2021-04-26 DIAGNOSIS — D696 Thrombocytopenia, unspecified: Secondary | ICD-10-CM

## 2021-04-26 DIAGNOSIS — I10 Essential (primary) hypertension: Secondary | ICD-10-CM

## 2021-04-26 DIAGNOSIS — R7303 Prediabetes: Secondary | ICD-10-CM

## 2021-04-26 MED ORDER — RIVAROXABAN 20 MG PO TABS: 20.0000 mg | ORAL_TABLET | Freq: Every day | ORAL | 0 refills | Status: DC

## 2021-04-26 MED ORDER — METOPROLOL TARTRATE 50 MG PO TABS: 50.0000 mg | ORAL_TABLET | Freq: Two times a day (BID) | ORAL | 0 refills | Status: DC

## 2021-04-26 MED ORDER — LOSARTAN POTASSIUM 100 MG PO TABS
100.0000 mg | ORAL_TABLET | Freq: Every day | ORAL | 0 refills | Status: DC
Start: 2021-04-26 — End: 2021-07-19

## 2021-04-26 MED ORDER — POTASSIUM CHLORIDE ER 10 MEQ PO TBCR: 10.0000 meq | EXTENDED_RELEASE_TABLET | Freq: Two times a day (BID) | ORAL | 0 refills | Status: DC

## 2021-04-26 MED ORDER — FLUTICASONE-SALMETEROL 100-50 MCG/DOSE IN AEPB: 1.0000 | INHALATION_SPRAY | Freq: Two times a day (BID) | RESPIRATORY_TRACT | 0 refills | Status: DC

## 2021-04-26 MED ORDER — FUROSEMIDE 40 MG PO TABS: 60.0000 mg | ORAL_TABLET | Freq: Every day | ORAL | 0 refills | Status: DC

## 2021-04-26 MED ORDER — ALBUTEROL SULFATE HFA 108 (90 BASE) MCG/ACT IN AERS: INHALATION_SPRAY | RESPIRATORY_TRACT | 0 refills | Status: DC

## 2021-04-26 NOTE — Progress Notes (Signed)
BP (!) 160/86   Pulse 88   Temp (!) 96.5 F (35.8 C)   Wt (!) 327 lb (148.3 kg)   SpO2 91%   BMI 44.35 kg/m    Subjective:    Patient ID: Adam Marquez, male    DOB: 1956-04-08, 65 y.o.   MRN: [id number]  HPI: Adam Marquez is a 65 y.o. male presenting on 04/26/2021 for No chief complaint on file.   HPI  Pt had a negative covid 19 screening questionnaire.    Pt is 64yoM who is returning.  He has history of appointment noncompliance and he cancelled his appointment here scheduled for December.     He has 2 bottles of htn meds with labels unreadable that are both empy.   One is 100mg  qd and the other is bid unknown mg    Perhaps losartan and metoprolol  He has Not got covid booster yet.  He says his mood is good.    He was in-patient in January with copd exacerbation.  He says his breathing has been good since that time. He continues to smoke.   He says he renewed his medassist and says he got letter that he is approved.  He says he is doing okay.  He denies cp and says his Breathing is okay.     He is wearing a knee brace.  He says he twisted it when he stepped in a hole in the yard 3 weeks ago.  He says it is getting better.    He asks about relief factor, a joint medication he heard advertised on the radio.  It was recommended that he try glucosamine which has been tested and shown some benefit.   He hasn't seen cardiology since march 2021.  He was followed there for For chf, afib.  He had appointment in january that was cancelled due to weather.   He says no one ever called him back to reschedule.  He had ekg in January while hospitalized that was sinus rhythm   Pt says he finally submittled application for [employer] financial assistance but says he hasn't heard whether he was approved or not.    Relevant past medical, surgical, family and social history reviewed and updated as indicated. Interim medical history since our last visit  reviewed. Allergies and medications reviewed and updated.   Current Outpatient Medications:  .  acetaminophen (TYLENOL) 500 MG tablet, Take 500 mg by mouth every 6 (six) hours as needed., Disp: , Rfl:  .  albuterol (PROVENTIL HFA) 108 (90 Base) MCG/ACT inhaler, INHALE 2 PUFFS BY MOUTH EVERY 4 HOURS AS NEEDED FOR COUGHING, WHEEZING, OR SHORTNESS OF BREATH, Disp: 3 each, Rfl: 0 .  fluticasone (FLONASE) 50 MCG/ACT nasal spray, Place into both nostrils daily., Disp: , Rfl:  .  Fluticasone-Salmeterol (ADVAIR DISKUS) 100-50 MCG/DOSE AEPB, Inhale 1 puff into the lungs in the morning and at bedtime., Disp: 60 each, Rfl: 0 .  furosemide (LASIX) 40 MG tablet, Take 1.5 tablets (60 mg total) by mouth daily., Disp: 90 tablet, Rfl: 3 .  Polyvinyl Alcohol-Povidone (CLEAR EYES ALL SEASONS) 5-6 MG/ML SOLN, Apply 1 drop to eye daily as needed (for allergies)., Disp: , Rfl:  .  rivaroxaban (XARELTO) 20 MG TABS tablet, Take 1 tablet (20 mg total) by mouth daily with supper., Disp: 90 tablet, Rfl: 1 .  metoprolol tartrate (LOPRESSOR) 50 MG tablet, Take 1 tablet (50 mg total) by mouth 2 (two) times daily. (Patient not taking: Reported on  04/26/2021), Disp: 180 tablet, Rfl: 0 .  potassium chloride (KLOR-CON) 10 MEQ tablet, Take 1 tablet (10 mEq total) by mouth 2 (two) times daily. (Patient not taking: Reported on 04/26/2021), Disp: 180 tablet, Rfl: 0   Review of Systems  Per HPI unless specifically indicated above     Objective:    BP (!) 160/86   Pulse 88   Temp (!) 96.5 F (35.8 C)   Wt (!) 327 lb (148.3 kg)   SpO2 91%   BMI 44.35 kg/m   Wt Readings from Last 3 Encounters:  04/26/21 (!) 327 lb (148.3 kg)  01/08/21 299 lb 13.2 oz (136 kg)  11/10/20 (!) 323 lb (146.5 kg)    Physical Exam Vitals reviewed.  Constitutional:      General: He is not in acute distress.    Appearance: He is well-developed. He is obese. He is not toxic-appearing.  HENT:     Head: Normocephalic and atraumatic.   Cardiovascular:     Rate and Rhythm: Normal rate and regular rhythm.  Pulmonary:     Effort: Pulmonary effort is normal.     Breath sounds: Normal breath sounds. No wheezing.  Abdominal:     General: Bowel sounds are normal.     Palpations: Abdomen is soft.     Tenderness: There is no abdominal tenderness.  Musculoskeletal:     Cervical back: Neck supple.     Left knee: No deformity or bony tenderness. Normal range of motion. Tenderness present.     Right lower leg: No edema.     Left lower leg: No edema.     Comments: Chronic stasis edema BLE no pitting. L knee with mild non-point tenderness, no instability.  Mild creptitus with ROM  Lymphadenopathy:     Cervical: No cervical adenopathy.  Skin:    General: Skin is warm and dry.  Neurological:     Mental Status: He is alert and oriented to person, place, and time.  Psychiatric:        Behavior: Behavior normal.              Assessment & Plan:      Encounter Diagnoses  Name Primary?  . Essential hypertension Yes  . Chronic diastolic heart failure (HCC)   . Chronic atrial fibrillation (HCC)   . Anticoagulated   . Prediabetes   . Morbid obesity (HCC)   . Chronic obstructive pulmonary disease, unspecified COPD type (HCC)   . Tobacco use disorder   . Thrombocytopenia (HCC)   . Acute pain of left knee        -rx sent to medassist.  Will restart the metoprolol and losartan -Will re-refer to cardiology -encouraged smoking cessation, particularly in light of copd -will check to see if pt approved for cafa -discussed prediabetes and elevated bs when on prednisone -Pt will f/u 1 month to recheck bp with labs before appt

## 2021-05-25 ENCOUNTER — Other Ambulatory Visit: Payer: Self-pay | Admitting: Physician Assistant

## 2021-05-25 ENCOUNTER — Ambulatory Visit: Payer: Self-pay | Admitting: Physician Assistant

## 2021-05-25 ENCOUNTER — Encounter: Payer: Self-pay | Admitting: Physician Assistant

## 2021-05-25 ENCOUNTER — Other Ambulatory Visit: Payer: Self-pay

## 2021-05-25 VITALS — BP 133/94 | HR 50 | Temp 97.3°F | Wt 327.0 lb

## 2021-05-25 DIAGNOSIS — F17200 Nicotine dependence, unspecified, uncomplicated: Secondary | ICD-10-CM

## 2021-05-25 DIAGNOSIS — Z1211 Encounter for screening for malignant neoplasm of colon: Secondary | ICD-10-CM

## 2021-05-25 DIAGNOSIS — J449 Chronic obstructive pulmonary disease, unspecified: Secondary | ICD-10-CM

## 2021-05-25 DIAGNOSIS — Z7901 Long term (current) use of anticoagulants: Secondary | ICD-10-CM

## 2021-05-25 DIAGNOSIS — I10 Essential (primary) hypertension: Secondary | ICD-10-CM

## 2021-05-25 DIAGNOSIS — I482 Chronic atrial fibrillation, unspecified: Secondary | ICD-10-CM

## 2021-05-25 DIAGNOSIS — I5032 Chronic diastolic (congestive) heart failure: Secondary | ICD-10-CM

## 2021-05-25 DIAGNOSIS — D696 Thrombocytopenia, unspecified: Secondary | ICD-10-CM

## 2021-05-25 DIAGNOSIS — L304 Erythema intertrigo: Secondary | ICD-10-CM

## 2021-05-25 NOTE — Patient Instructions (Addendum)
Cardiology Phone: [phone] 

## 2021-05-25 NOTE — Progress Notes (Signed)
BP (!) 133/94   Pulse (!) 50   Temp (!) 97.3 F (36.3 C)   Wt (!) 327 lb (148.3 kg)   SpO2 97%   BMI 44.35 kg/m    Subjective:    Patient ID: Adam Marquez, male    DOB: 28-Apr-1956, 65 y.o.   MRN: [id number]  HPI: Adonay Scheier is a 65 y.o. male presenting on 05/25/2021 for No chief complaint on file.   HPI   Pt had a negative covid 19 screening questionnaire.   Pt is 64yoM who presents for routine follow up of HTN..   Pt didn't get his labs done  He's been feeling good lately.  He was referred back to cardiology at last appointment and it appears that they attempted to call him but an appointment hasn't been made yet.   It has been over a year since he was seen by his cardiologist.   He is Still smoking some  He has not renewed with medassist medication assistance program  He reports rash left axilla      Relevant past medical, surgical, family and social history reviewed and updated as indicated. Interim medical history since our last visit reviewed. Allergies and medications reviewed and updated.      Current Outpatient Medications:  .  albuterol (PROVENTIL HFA) 108 (90 Base) MCG/ACT inhaler, INHALE 2 PUFFS BY MOUTH EVERY 4 HOURS AS NEEDED FOR COUGHING, WHEEZING, OR SHORTNESS OF BREATH, Disp: 3 each, Rfl: 0 .  diphenhydramine-acetaminophen (TYLENOL PM) 25-500 MG TABS tablet, Take 1 tablet by mouth at bedtime as needed., Disp: , Rfl:  .  fluticasone (FLONASE) 50 MCG/ACT nasal spray, Place into both nostrils daily., Disp: , Rfl:  .  Fluticasone-Salmeterol (ADVAIR DISKUS) 100-50 MCG/DOSE AEPB, Inhale 1 puff into the lungs in the morning and at bedtime., Disp: 180 each, Rfl: 0 .  furosemide (LASIX) 40 MG tablet, Take 1.5 tablets (60 mg total) by mouth daily., Disp: 90 tablet, Rfl: 0 .  losartan (COZAAR) 100 MG tablet, Take 1 tablet (100 mg total) by mouth daily., Disp: 90 tablet, Rfl: 0 .  metoprolol tartrate (LOPRESSOR) 50 MG tablet, Take 1 tablet  (50 mg total) by mouth 2 (two) times daily., Disp: 180 tablet, Rfl: 0 .  Polyvinyl Alcohol-Povidone (CLEAR EYES ALL SEASONS) 5-6 MG/ML SOLN, Apply 1 drop to eye daily as needed (for allergies)., Disp: , Rfl:  .  potassium chloride (KLOR-CON) 10 MEQ tablet, Take 1 tablet (10 mEq total) by mouth 2 (two) times daily., Disp: 180 tablet, Rfl: 0 .  rivaroxaban (XARELTO) 20 MG TABS tablet, Take 1 tablet (20 mg total) by mouth daily with supper., Disp: 90 tablet, Rfl: 0 .  VITAMIN D PO, Take by mouth., Disp: , Rfl:     Review of Systems  Per HPI unless specifically indicated above     Objective:    BP (!) 133/94   Pulse (!) 50   Temp (!) 97.3 F (36.3 C)   Wt (!) 327 lb (148.3 kg)   SpO2 97%   BMI 44.35 kg/m   Wt Readings from Last 3 Encounters:  05/25/21 (!) 327 lb (148.3 kg)  04/26/21 (!) 327 lb (148.3 kg)  01/08/21 299 lb 13.2 oz (136 kg)    Physical Exam Vitals reviewed.  Constitutional:      General: He is not in acute distress.    Appearance: He is well-developed. He is obese. He is not toxic-appearing.  HENT:     Head: Normocephalic and  atraumatic.  Cardiovascular:     Rate and Rhythm: Normal rate. Rhythm irregular.  Pulmonary:     Effort: Pulmonary effort is normal.     Breath sounds: Normal breath sounds. No wheezing.  Abdominal:     General: Bowel sounds are normal.     Palpations: Abdomen is soft.     Tenderness: There is no abdominal tenderness.  Musculoskeletal:     Cervical back: Neck supple.     Comments: Chronic stasis changes BLE  Lymphadenopathy:     Cervical: No cervical adenopathy.  Skin:    General: Skin is warm and dry.     Comments: Reddened thickened skin, irritated, left axilla  Neurological:     Mental Status: He is alert and oriented to person, place, and time.  Psychiatric:        Attention and Perception: Attention normal.        Speech: Speech normal.        Behavior: Behavior normal. Behavior is cooperative.     Comments: Mood improved            Assessment & Plan:   Encounter Diagnoses  Name Primary?  . Essential hypertension Yes  . Chronic diastolic heart failure (HCC)   . Chronic atrial fibrillation (HCC)   . Anticoagulated   . Morbid obesity (HCC)   . Chronic obstructive pulmonary disease, unspecified COPD type (HCC)   . Tobacco use disorder   . Thrombocytopenia (HCC)   . Intertrigo   . Screening for colon cancer      -pt to Get fasting labs drawn.  He will be called with results -pt to Call cardiology back to get appointment scheduled.  He was given the phone number -pt is given Forms for medassist renewal -pt is given [employer] application for advair since it is no longer available through medassist.  He knows that this form will need to be returned to [facility] -he is encouraged to discontinue smoking -pt is given [employer] financial assistance application so he can see cardiologist  -pt is given [facility] contact information to get assistance with completion of all these papers/applications -pt is given FIT test for colon cancer screening  -OTC antifungal to left axilla.  He is counseled on keeping the area dry -no medication changes today -pt to follow up 3 months.  He is to contact office sooner prn

## 2021-06-28 NOTE — Progress Notes (Deleted)
Cardiology Office Note    Date:  06/28/2021   ID:  [name], [name] [birth year], MRN [id number]   PCP:  [facility]  Cardiologist:  Dina Rich, MD   Advanced Practice Provider:  No care team member to display Electrophysiologist:  None   [phone]   No chief complaint on file.   History of Present Illness:  Adam Marquez is a 65 y.o. male with history of persistent atrial fibrillation DCCV 10/2019, chronic diastolic CHF, hypertension, COPD.  Patient last saw [place], PA-C with worsening lower extremity edema and had stopped diltiazem and Eliquis because of cost.  Lasix was increased to 40 mg twice daily and his water intake to 2 L daily.  He was unable to afford Coumadin check so was enrolled in patient assistance.  Referred to pulmonary for sleep disordered breathing.  Patient hospitalized 12/2020 with respiratory failure secondary to COPD exacerbation and had elevated troponins felt secondary to demand ischemia.  2D echo 01/03/2021 normal LVEF 50 to 55% with mild LVH    Past Medical History:  Diagnosis Date   Atrial fibrillation [facility])    a. s/p DCCV in 10/2019   COPD (chronic obstructive pulmonary disease) (HCC)    Eczema    Hyperlipidemia    Hypertension     Past Surgical History:  Procedure Laterality Date   ANAL FISSURE REPAIR  1980   CARDIOVERSION N/A 10/29/2019   Procedure: CARDIOVERSION WITH PROPOFOL;  Surgeon: Laqueta Linden, MD;  Location: [facility];  Service: Cardiovascular;  Laterality: N/A;   MOUTH SURGERY  1990   TEE WITHOUT CARDIOVERSION N/A 10/29/2019   Procedure: TRANSESOPHAGEAL ECHOCARDIOGRAM (TEE) WITH PROPOFOL;  Surgeon: Laqueta Linden, MD;  Location: [facility];  Service: Cardiovascular;  Laterality: N/A;    Current Medications: No outpatient medications have been marked as taking for the 07/10/21 encounter (Appointment) with Dyann Kief, PA-C.     Allergies:    Keflex [cephalexin]   Social History   Socioeconomic History   Marital status: Single    Spouse name: Not on file   Number of children: Not on file   Years of education: Not on file   Highest education level: Not on file  Occupational History   Not on file  Tobacco Use   Smoking status: Some Days    Packs/day: 0.25    Years: 42.00    Pack years: 10.50    Types: Cigarettes   Smokeless tobacco: Never   Tobacco comments:    3 cigs daily - 11-10-2020  Vaping Use   Vaping Use: Never used  Substance and Sexual Activity   Alcohol use: Yes    Comment: a 12 pack of beer a week.   Drug use: Never   Sexual activity: Not on file  Other Topics Concern   Not on file  Social History Narrative   Not on file   Social Determinants of Health   Financial Resource Strain: Not on file  Food Insecurity: Not on file  Transportation Needs: Not on file  Physical Activity: Not on file  Stress: Not on file  Social Connections: Not on file     Family History:  The patient's ***family history includes Cancer in his daughter; Diabetes in his brother; Heart disease in his father; Hypertension in his father; Lung cancer in his mother.   ROS:   Please see the history of present illness.    ROS All other systems reviewed  and are negative.   PHYSICAL EXAM:   VS:  There were no vitals taken for this visit.  Physical Exam  GEN: Well nourished, well developed, in no acute distress  HEENT: normal  Neck: no JVD, carotid bruits, or masses Cardiac:RRR; no murmurs, rubs, or gallops  Respiratory:  clear to auscultation bilaterally, normal work of breathing GI: soft, nontender, nondistended, + BS Ext: without cyanosis, clubbing, or edema, Good distal pulses bilaterally MS: no deformity or atrophy  Skin: warm and dry, no rash Neuro:  Alert and Oriented x 3, Strength and sensation are intact Psych: euthymic mood, full affect  Wt Readings from Last 3 Encounters:  05/25/21 (!) 327 lb (148.3 kg)   04/26/21 (!) 327 lb (148.3 kg)  01/08/21 299 lb 13.2 oz (136 kg)      Studies/Labs Reviewed:   EKG:  EKG is*** ordered today.  The ekg ordered today demonstrates ***  Recent Labs: 01/03/2021: B Natriuretic Peptide 456.0; TSH 1.909 01/04/2021: ALT 16 01/05/2021: Hemoglobin 16.7; Magnesium 2.4; Platelets 125 01/07/2021: BUN 38; Creatinine, Ser 1.02; Potassium 4.8; Sodium 132   Lipid Panel    Component Value Date/Time   CHOL 157 11/02/2020 1313   TRIG 96 01/03/2021 0243   HDL 47 11/02/2020 1313   CHOLHDL 3.3 11/02/2020 1313   VLDL 18 11/02/2020 1313   LDLCALC 92 11/02/2020 1313    Additional studies/ records that were reviewed today include:  2D echo 01/03/2021 IMPRESSIONS     1. Left ventricular ejection fraction, by estimation, is 50 to 55%. The  left ventricle has low normal function. The left ventricle has no regional  wall motion abnormalities. There is mild left ventricular hypertrophy.  Left ventricular diastolic  parameters are indeterminate.   2. Right ventricular systolic function is low normal. The right  ventricular size is mildly enlarged. Tricuspid regurgitation signal is  inadequate for assessing PA pressure.   3. The mitral valve is grossly normal. Trivial mitral valve  regurgitation.   4. The aortic valve is tricuspid. There is mild calcification of the  aortic valve. Aortic valve regurgitation is not visualized.   5. The inferior vena cava is normal in size with greater than 50%  respiratory variability, suggesting right atrial pressure of 3 mmHg.    Risk Assessment/Calculations:   {Does this patient have ATRIAL FIBRILLATION?:[id number]}     ASSESSMENT:    No diagnosis found.   PLAN:  In order of problems listed above:  Persistent atrial fibrillation status post DCCV 2019 sinus rhythm, could not afford Eliquis and switch to Xarelto  Chronic diastolic CHF goal 01/03/2021 normal L the EF with LVH  Hypertension  COPD  Shared Decision  Making/Informed Consent   {Are you ordering a CV Procedure (e.g. stress test, cath, DCCV, TEE, etc)?   Press F2        :[id number]}    Medication Adjustments/Labs and Tests Ordered: Current medicines are reviewed at length with the patient today.  Concerns regarding medicines are outlined above.  Medication changes, Labs and Tests ordered today are listed in the Patient Instructions below. There are no Patient Instructions on file for this visit.   Elson Clan, PA-C  06/28/2021 10:16 AM    [facility] [address] [place], [place], [place]  [address] Phone: [phone]; Fax: [phone]

## 2021-07-10 ENCOUNTER — Ambulatory Visit: Payer: Self-pay | Admitting: Physician Assistant

## 2021-07-19 ENCOUNTER — Other Ambulatory Visit: Payer: Self-pay | Admitting: Physician Assistant

## 2021-08-14 ENCOUNTER — Other Ambulatory Visit: Payer: Self-pay | Admitting: Physician Assistant

## 2021-08-22 ENCOUNTER — Other Ambulatory Visit: Payer: Self-pay | Admitting: Physician Assistant

## 2021-08-24 ENCOUNTER — Ambulatory Visit: Payer: Self-pay | Admitting: Physician Assistant

## 2021-08-24 ENCOUNTER — Encounter: Payer: Self-pay | Admitting: Physician Assistant

## 2021-08-24 ENCOUNTER — Other Ambulatory Visit: Payer: Self-pay

## 2021-08-24 VITALS — BP 152/89 | HR 95 | Temp 98.2°F | Wt 329.0 lb

## 2021-08-24 DIAGNOSIS — I5032 Chronic diastolic (congestive) heart failure: Secondary | ICD-10-CM

## 2021-08-24 DIAGNOSIS — I10 Essential (primary) hypertension: Secondary | ICD-10-CM

## 2021-08-24 DIAGNOSIS — Z7901 Long term (current) use of anticoagulants: Secondary | ICD-10-CM

## 2021-08-24 DIAGNOSIS — J449 Chronic obstructive pulmonary disease, unspecified: Secondary | ICD-10-CM

## 2021-08-24 DIAGNOSIS — D696 Thrombocytopenia, unspecified: Secondary | ICD-10-CM

## 2021-08-24 DIAGNOSIS — R062 Wheezing: Secondary | ICD-10-CM

## 2021-08-24 DIAGNOSIS — F17200 Nicotine dependence, unspecified, uncomplicated: Secondary | ICD-10-CM

## 2021-08-24 DIAGNOSIS — I482 Chronic atrial fibrillation, unspecified: Secondary | ICD-10-CM

## 2021-08-24 DIAGNOSIS — R7303 Prediabetes: Secondary | ICD-10-CM

## 2021-08-24 DIAGNOSIS — R7309 Other abnormal glucose: Secondary | ICD-10-CM

## 2021-08-24 DIAGNOSIS — G8929 Other chronic pain: Secondary | ICD-10-CM

## 2021-08-24 MED ORDER — AIRDUO DIGIHALER 232-14 MCG/ACT IN AEPB: 1.0000 | INHALATION_SPRAY | Freq: Two times a day (BID) | RESPIRATORY_TRACT | Status: DC

## 2021-08-24 MED ORDER — METOPROLOL TARTRATE 50 MG PO TABS: 50.0000 mg | ORAL_TABLET | Freq: Two times a day (BID) | ORAL | 0 refills | Status: DC

## 2021-08-24 MED ORDER — AIRDUO DIGIHALER 232-14 MCG/ACT IN AEPB: 2.0000 | INHALATION_SPRAY | Freq: Two times a day (BID) | RESPIRATORY_TRACT | Status: DC

## 2021-08-24 MED ORDER — FUROSEMIDE 40 MG PO TABS: 60.0000 mg | ORAL_TABLET | Freq: Every day | ORAL | 0 refills | Status: DC

## 2021-08-24 MED ORDER — RIVAROXABAN 20 MG PO TABS: 20.0000 mg | ORAL_TABLET | Freq: Every day | ORAL | 0 refills | Status: AC

## 2021-08-24 MED ORDER — ADVAIR HFA 115-21 MCG/ACT IN AERO: 2.0000 | INHALATION_SPRAY | Freq: Two times a day (BID) | RESPIRATORY_TRACT | 3 refills | Status: AC

## 2021-08-24 MED ORDER — LOSARTAN POTASSIUM 100 MG PO TABS: 100.0000 mg | ORAL_TABLET | Freq: Every day | ORAL | 0 refills | Status: DC

## 2021-08-24 MED ORDER — POTASSIUM CHLORIDE ER 10 MEQ PO CPCR: 10.0000 meq | ORAL_CAPSULE | Freq: Two times a day (BID) | ORAL | 0 refills | Status: DC

## 2021-08-24 MED ORDER — ALBUTEROL SULFATE HFA 108 (90 BASE) MCG/ACT IN AERS: INHALATION_SPRAY | RESPIRATORY_TRACT | 0 refills | Status: AC

## 2021-08-24 NOTE — Progress Notes (Signed)
BP (!) 152/89   Pulse 95   Temp 98.2 F (36.8 C)   Wt (!) 329 lb (149.2 kg)   SpO2 94%   BMI 44.62 kg/m    Subjective:    Patient ID: Adam Marquez, male    DOB: 1956-10-23, 65 y.o.   MRN: [id number]  HPI: Antwione Picotte is a 65 y.o. male presenting on 08/24/2021 for Follow-up   HPI     Pt had a negative covid 19 screening questionnaire.     Pt with CHF, AFib, anticoagulated, HTN, morbid obesity, COPD, and chronic non-compliance.  At his appointment in May, he was urged to return call to cardiology to schedule follow-up; he finally called earlier this month and has appointment for September.    At his appointment in May, he was urged to get his labs drawn; he did not.  He was given application for his advair at his May appointment that he needed to complete and return to office to be submitted; he is only retuning it to the office today.  He has not returned his FIT test for colon cancer screening that was given to him months ago; he says it was thrown in the trash.    Pt has chronic cough but he says it hasn't changed for 30 years.    He says breathing has been okay.  No chest pains.   He says his swelling "hasn't been too bad".   He still needs to get appointment with [facility] to get assistance completing  [employer] financial assistance application.  He says his biggest problem is his knees; they both hurt a lot.   He says his mood is doing better.  He denies SI, HI.     Relevant past medical, surgical, family and social history reviewed and updated as indicated. Interim medical history since our last visit reviewed. Allergies and medications reviewed and updated.   Current Outpatient Medications:  .  albuterol (PROVENTIL HFA) 108 (90 Base) MCG/ACT inhaler, INHALE 2 PUFFS BY MOUTH EVERY 4 HOURS AS NEEDED FOR COUGHING, WHEEZING, OR SHORTNESS OF BREATH, Disp: 20.1 g, Rfl: 0 .  furosemide (LASIX) 40 MG tablet, Take 1.5 tablets (60 mg total) by mouth  daily., Disp: 90 tablet, Rfl: 0 .  potassium chloride (MICRO-K) 10 MEQ CR capsule, TAKE 1 Capsule  BY MOUTH TWICE DAILY, Disp: 180 capsule, Rfl: 0 .  rivaroxaban (XARELTO) 20 MG TABS tablet, Take 1 tablet (20 mg total) by mouth daily with supper., Disp: 90 tablet, Rfl: 0 .  diphenhydramine-acetaminophen (TYLENOL PM) 25-500 MG TABS tablet, Take 1 tablet by mouth at bedtime as needed. (Patient not taking: Reported on 08/24/2021), Disp: , Rfl:  .  fluticasone (FLONASE) 50 MCG/ACT nasal spray, Place into both nostrils daily. (Patient not taking: Reported on 08/24/2021), Disp: , Rfl:  .  Fluticasone-Salmeterol (ADVAIR DISKUS) 100-50 MCG/DOSE AEPB, Inhale 1 puff into the lungs in the morning and at bedtime. (Patient not taking: Reported on 08/24/2021), Disp: 180 each, Rfl: 0 .  losartan (COZAAR) 100 MG tablet, TAKE 1 Tablet BY MOUTH ONCE DAILY (Patient not taking: Reported on 08/24/2021), Disp: 90 tablet, Rfl: 0 .  metoprolol tartrate (LOPRESSOR) 50 MG tablet, TAKE 1 Tablet  BY MOUTH TWICE DAILY (Patient not taking: Reported on 08/24/2021), Disp: 180 tablet, Rfl: 0 .  Polyvinyl Alcohol-Povidone (CLEAR EYES ALL SEASONS) 5-6 MG/ML SOLN, Apply 1 drop to eye daily as needed (for allergies). (Patient not taking: Reported on 08/24/2021), Disp: , Rfl:  .  VITAMIN D PO, Take by mouth. (Patient not taking: Reported on 08/24/2021), Disp: , Rfl:    Review of Systems  Per HPI unless specifically indicated above     Objective:    BP (!) 152/89   Pulse 95   Temp 98.2 F (36.8 C)   Wt (!) 329 lb (149.2 kg)   SpO2 94%   BMI 44.62 kg/m   Wt Readings from Last 3 Encounters:  08/24/21 (!) 329 lb (149.2 kg)  05/25/21 (!) 327 lb (148.3 kg)  04/26/21 (!) 327 lb (148.3 kg)    Physical Exam Vitals reviewed.  Constitutional:      General: He is not in acute distress.    Appearance: He is well-developed. He is obese. He is not ill-appearing.  HENT:     Head: Normocephalic and atraumatic.  Cardiovascular:     Rate  and Rhythm: Normal rate. Rhythm irregular.  Pulmonary:     Effort: Pulmonary effort is normal.     Breath sounds: Normal breath sounds. No wheezing.  Abdominal:     General: Bowel sounds are normal.     Palpations: Abdomen is soft.     Tenderness: There is no abdominal tenderness.  Musculoskeletal:     Cervical back: Neck supple.     Right knee: Tenderness present.     Left knee: Tenderness present.     Right lower leg: Edema present.     Left lower leg: Edema present.     Comments: Mild diffuse tenderness B knees  Lymphadenopathy:     Cervical: No cervical adenopathy.  Skin:    General: Skin is warm and dry.  Neurological:     Mental Status: He is alert and oriented to person, place, and time.  Psychiatric:        Behavior: Behavior normal.           Assessment & Plan:   Encounter Diagnoses  Name Primary?  . Essential hypertension Yes  . Chronic diastolic heart failure (HCC)   . Chronic atrial fibrillation (HCC)   . Anticoagulated   . Morbid obesity (HCC)   . Chronic obstructive pulmonary disease, unspecified COPD type (HCC)   . Tobacco use disorder   . Chronic pain of right knee   . Chronic pain of left knee   . Wheezes   . Thrombocytopenia (HCC)   . Elevated glucose   . Prediabetes      Had long conversation with pt and Pt says he is motivated now to take care of himself.  Pt to get Labs & xrays  Pt to call [facility] to get help with submitting his [employer] financial assistance application  Will refer to orthopedics for his knees when he gets cafa   Pt to follow up with cardiology as scheduled  Pt is more upbeat today and is very much in agreement with plan and getting himself doing better  Pt to follow up  1 months.  He is to contact office sooner prn

## 2021-08-28 ENCOUNTER — Ambulatory Visit (HOSPITAL_COMMUNITY)
Admission: RE | Admit: 2021-08-28 | Discharge: 2021-08-28 | Disposition: A | Discharge status: Home / Self Care | Payer: Self-pay | Source: Ambulatory Visit | Attending: Physician Assistant | Admitting: Physician Assistant

## 2021-08-28 ENCOUNTER — Other Ambulatory Visit: Payer: Self-pay

## 2021-08-28 ENCOUNTER — Other Ambulatory Visit (HOSPITAL_COMMUNITY)
Admission: RE | Admit: 2021-08-28 | Discharge: 2021-08-28 | Disposition: A | Discharge status: Home / Self Care | Payer: Self-pay | Source: Ambulatory Visit | Attending: Physician Assistant | Admitting: Physician Assistant

## 2021-08-28 DIAGNOSIS — G8929 Other chronic pain: Secondary | ICD-10-CM | POA: Insufficient documentation

## 2021-08-28 DIAGNOSIS — Z7901 Long term (current) use of anticoagulants: Secondary | ICD-10-CM

## 2021-08-28 DIAGNOSIS — R7303 Prediabetes: Secondary | ICD-10-CM

## 2021-08-28 DIAGNOSIS — D696 Thrombocytopenia, unspecified: Secondary | ICD-10-CM

## 2021-08-28 DIAGNOSIS — M25561 Pain in right knee: Secondary | ICD-10-CM | POA: Insufficient documentation

## 2021-08-28 DIAGNOSIS — M25562 Pain in left knee: Secondary | ICD-10-CM | POA: Insufficient documentation

## 2021-08-28 DIAGNOSIS — I10 Essential (primary) hypertension: Secondary | ICD-10-CM | POA: Insufficient documentation

## 2021-08-28 DIAGNOSIS — I5032 Chronic diastolic (congestive) heart failure: Secondary | ICD-10-CM

## 2021-08-28 DIAGNOSIS — R062 Wheezing: Secondary | ICD-10-CM | POA: Insufficient documentation

## 2021-08-28 DIAGNOSIS — R7309 Other abnormal glucose: Secondary | ICD-10-CM

## 2021-08-28 DIAGNOSIS — F17200 Nicotine dependence, unspecified, uncomplicated: Secondary | ICD-10-CM | POA: Insufficient documentation

## 2021-08-28 DIAGNOSIS — J449 Chronic obstructive pulmonary disease, unspecified: Secondary | ICD-10-CM | POA: Insufficient documentation

## 2021-08-28 DIAGNOSIS — I482 Chronic atrial fibrillation, unspecified: Secondary | ICD-10-CM | POA: Insufficient documentation

## 2021-08-28 LAB — HEMOGLOBIN A1C
Hgb A1c MFr Bld: 5.9 % — ABNORMAL HIGH (ref 4.8–5.6)
Mean Plasma Glucose: 122.63 mg/dL

## 2021-08-28 LAB — LIPID PANEL
Cholesterol: 154 mg/dL (ref 0–200)
HDL: 39 mg/dL — ABNORMAL LOW
LDL Cholesterol: 92 mg/dL (ref 0–99)
Total CHOL/HDL Ratio: 3.9 ratio
Triglycerides: 115 mg/dL
VLDL: 23 mg/dL (ref 0–40)

## 2021-08-28 LAB — COMPREHENSIVE METABOLIC PANEL
ALT: 15 U/L (ref 0–44)
AST: 34 U/L (ref 15–41)
Albumin: 3.5 g/dL (ref 3.5–5.0)
Alkaline Phosphatase: 112 U/L (ref 38–126)
Anion gap: 11 (ref 5–15)
BUN: 14 mg/dL (ref 8–23)
CO2: 30 mmol/L (ref 22–32)
Calcium: 9.1 mg/dL (ref 8.9–10.3)
Chloride: 98 mmol/L (ref 98–111)
Creatinine, Ser: 1.06 mg/dL (ref 0.61–1.24)
GFR, Estimated: >60 mL/min (ref >60)
Glucose, Bld: 121 mg/dL — ABNORMAL HIGH (ref 70–99)
Potassium: 3.8 mmol/L (ref 3.5–5.1)
Sodium: 139 mmol/L (ref 135–145)
Total Bilirubin: 1.4 mg/dL — ABNORMAL HIGH (ref 0.3–1.2)
Total Protein: 6.7 g/dL (ref 6.5–8.1)

## 2021-08-28 LAB — CBC
HCT: 42.6 % (ref 39.0–52.0)
Hemoglobin: 14.6 g/dL (ref 13.0–17.0)
MCH: 38.6 pg — ABNORMAL HIGH (ref 26.0–34.0)
MCHC: 34.3 g/dL (ref 30.0–36.0)
MCV: 112.7 fL — ABNORMAL HIGH (ref 80.0–100.0)
Platelets: 190 10*3/uL (ref 150–400)
RBC: 3.78 MIL/uL — ABNORMAL LOW (ref 4.22–5.81)
RDW: 18.2 % — ABNORMAL HIGH (ref 11.5–15.5)
WBC: 7.4 10*3/uL (ref 4.0–10.5)
nRBC: 0 % (ref 0.0–0.2)

## 2021-09-05 NOTE — Progress Notes (Deleted)
Cardiology Office Note    Date:  09/05/2021   ID:  Adam Marquez, Adam Marquez 03-May-1956, MRN [id number]   PCP:  [facility]  Cardiologist:  Dina Rich, MD   Advanced Practice Provider:  No care team member to display Electrophysiologist:  None   [phone]   No chief complaint on file.   History of Present Illness:  Adam Marquez is a 65 y.o. male with history of persistent atrial fibrillation status post DCCV 10/2019 amiodarone stopped after short course, on Xarelto due to cost of Eliquis, chronic diastolic CHF, hypertension, COPD, morbid obesity.  We saw the patient in the hospital 01/03/2021 for elevated troponins in the setting of acute respiratory failure secondary to COPD exacerbation felt to be due to demand ischemia patient had no cardiac symptoms.  2D echo normal LVEF 50 to 55% no wall motion abnormality    Past Medical History:  Diagnosis Date   Atrial fibrillation (HCC)    a. s/p DCCV in 10/2019   COPD (chronic obstructive pulmonary disease) (HCC)    Eczema    Hyperlipidemia    Hypertension     Past Surgical History:  Procedure Laterality Date   ANAL FISSURE REPAIR  1980   CARDIOVERSION N/A 10/29/2019   Procedure: CARDIOVERSION WITH PROPOFOL;  Surgeon: Laqueta Linden, MD;  Location: [facility];  Service: Cardiovascular;  Laterality: N/A;   MOUTH SURGERY  1990   TEE WITHOUT CARDIOVERSION N/A 10/29/2019   Procedure: TRANSESOPHAGEAL ECHOCARDIOGRAM (TEE) WITH PROPOFOL;  Surgeon: Laqueta Linden, MD;  Location: [facility];  Service: Cardiovascular;  Laterality: N/A;    Current Medications: No outpatient medications have been marked as taking for the 09/18/21 encounter (Appointment) with Dyann Kief, PA-C.     Allergies:   Keflex [cephalexin]   Social History   Socioeconomic History   Marital status: Single    Spouse name: Not on file   Number of children: Not on file   Years of  education: Not on file   Highest education level: Not on file  Occupational History   Not on file  Tobacco Use   Smoking status: Some Days    Packs/day: 0.25    Years: 42.00    Pack years: 10.50    Types: Cigarettes   Smokeless tobacco: Never   Tobacco comments:    3 cigs daily - 11-10-2020  Vaping Use   Vaping Use: Never used  Substance and Sexual Activity   Alcohol use: Yes    Comment: a 12 pack of beer a week.   Drug use: Never   Sexual activity: Not on file  Other Topics Concern   Not on file  Social History Narrative   Not on file   Social Determinants of Health   Financial Resource Strain: Not on file  Food Insecurity: Not on file  Transportation Needs: Not on file  Physical Activity: Not on file  Stress: Not on file  Social Connections: Not on file     Family History:  The patient's ***family history includes Cancer in his daughter; Diabetes in his brother; Heart disease in his father; Hypertension in his father; Lung cancer in his mother.   ROS:   Please see the history of present illness.    ROS All other systems reviewed and are negative.   PHYSICAL EXAM:   VS:  There were no vitals taken for this visit.  Physical Exam  GEN: Well nourished, well developed, in  no acute distress  HEENT: normal  Neck: no JVD, carotid bruits, or masses Cardiac:RRR; no murmurs, rubs, or gallops  Respiratory:  clear to auscultation bilaterally, normal work of breathing GI: soft, nontender, nondistended, + BS Ext: without cyanosis, clubbing, or edema, Good distal pulses bilaterally MS: no deformity or atrophy  Skin: warm and dry, no rash Neuro:  Alert and Oriented x 3, Strength and sensation are intact Psych: euthymic mood, full affect  Wt Readings from Last 3 Encounters:  08/24/21 (!) 329 lb (149.2 kg)  05/25/21 (!) 327 lb (148.3 kg)  04/26/21 (!) 327 lb (148.3 kg)      Studies/Labs Reviewed:   EKG:  EKG is*** ordered today.  The ekg ordered today demonstrates  ***  Recent Labs: 01/03/2021: B Natriuretic Peptide 456.0; TSH 1.909 01/05/2021: Magnesium 2.4 08/28/2021: ALT 15; BUN 14; Creatinine, Ser 1.06; Hemoglobin 14.6; Platelets 190; Potassium 3.8; Sodium 139   Lipid Panel    Component Value Date/Time   CHOL 154 08/28/2021 0841   TRIG 115 08/28/2021 0841   HDL 39 (L) 08/28/2021 0841   CHOLHDL 3.9 08/28/2021 0841   VLDL 23 08/28/2021 0841   LDLCALC 92 08/28/2021 0841    Additional studies/ records that were reviewed today include:   2D echo 01/03/2021 IMPRESSIONS     1. Left ventricular ejection fraction, by estimation, is 50 to 55%. The  left ventricle has low normal function. The left ventricle has no regional  wall motion abnormalities. There is mild left ventricular hypertrophy.  Left ventricular diastolic  parameters are indeterminate.   2. Right ventricular systolic function is low normal. The right  ventricular size is mildly enlarged. Tricuspid regurgitation signal is  inadequate for assessing PA pressure.   3. The mitral valve is grossly normal. Trivial mitral valve  regurgitation.   4. The aortic valve is tricuspid. There is mild calcification of the  aortic valve. Aortic valve regurgitation is not visualized.   5. The inferior vena cava is normal in size with greater than 50%  respiratory variability, suggesting right atrial pressure of 3 mmHg.   Echo 10/23/2019 IMPRESSIONS     1. Left ventricular ejection fraction, by visual estimation, is 55 to  60%. The left ventricle has normal function. There is mildly increased  left ventricular hypertrophy.   2. Left ventricular diastolic Doppler parameters are indeterminate  pattern of LV diastolic filling.   3. Global right ventricle has low normal systolic function.The right  ventricular size is normal. No increase in right ventricular wall  thickness.   4. Left atrial size was moderately dilated.   5. Right atrial size was not well visualized.   6. The mitral valve was not  well visualized. No evidence of mitral valve  regurgitation. No evidence of mitral stenosis.   7. The tricuspid valve is not well visualized. Tricuspid valve  regurgitation was not visualized by color flow Doppler.   8. The aortic valve has an indeterminant number of cusps Aortic valve  regurgitation was not visualized by color flow Doppler. Structurally  normal aortic valve, with no evidence of sclerosis or stenosis.   9. The pulmonic valve was not well visualized. Pulmonic valve  regurgitation is not visualized by color flow Doppler.  10. The inferior vena cava is dilated in size with >50% respiratory  variability, suggesting right atrial pressure of 8 mmHg.        Risk Assessment/Calculations:   {Does this patient have ATRIAL FIBRILLATION?:[phone]}     ASSESSMENT:  No diagnosis found.   PLAN:  In order of problems listed above:  PAF on metoprolol and Xarelto  Chronic diastolic CHF Echo 01/03/2021 LVEF 50 to 55% mild LVH  Hypertension  COPD  Shared Decision Making/Informed Consent   {Are you ordering a CV Procedure (e.g. stress test, cath, DCCV, TEE, etc)?   Press F2        :[id number]}    Medication Adjustments/Labs and Tests Ordered: Current medicines are reviewed at length with the patient today.  Concerns regarding medicines are outlined above.  Medication changes, Labs and Tests ordered today are listed in the Patient Instructions below. There are no Patient Instructions on file for this visit.   Elson Clan, PA-C  09/05/2021 12:06 PM    [facility] [address] [place], [place], [place]  [address] Phone: [phone]; Fax: [phone]

## 2021-09-18 ENCOUNTER — Ambulatory Visit: Payer: Self-pay | Admitting: Physician Assistant

## 2021-09-18 DIAGNOSIS — I5032 Chronic diastolic (congestive) heart failure: Secondary | ICD-10-CM

## 2021-09-18 DIAGNOSIS — I480 Paroxysmal atrial fibrillation: Secondary | ICD-10-CM

## 2021-09-18 DIAGNOSIS — I10 Essential (primary) hypertension: Secondary | ICD-10-CM

## 2021-09-21 ENCOUNTER — Ambulatory Visit: Payer: Self-pay | Admitting: Physician Assistant

## 2021-11-07 ENCOUNTER — Telehealth: Payer: Self-pay | Admitting: Licensed Clinical Social Worker

## 2021-11-07 NOTE — Telephone Encounter (Signed)
[facility] left voicemail for patient offering scheduling for counseling services, contact information was provided.

## 2021-11-21 ENCOUNTER — Encounter: Payer: Self-pay | Admitting: Physician Assistant

## 2021-11-21 ENCOUNTER — Telehealth: Payer: Self-pay | Admitting: Licensed Clinical Social Worker

## 2021-11-21 ENCOUNTER — Ambulatory Visit: Payer: Self-pay | Admitting: Physician Assistant

## 2021-11-21 VITALS — BP 153/88 | HR 66 | Temp 97.2°F | Wt 350.0 lb

## 2021-11-21 DIAGNOSIS — G8929 Other chronic pain: Secondary | ICD-10-CM

## 2021-11-21 DIAGNOSIS — I10 Essential (primary) hypertension: Secondary | ICD-10-CM

## 2021-11-21 DIAGNOSIS — M170 Bilateral primary osteoarthritis of knee: Secondary | ICD-10-CM

## 2021-11-21 DIAGNOSIS — I482 Chronic atrial fibrillation, unspecified: Secondary | ICD-10-CM

## 2021-11-21 DIAGNOSIS — M25562 Pain in left knee: Secondary | ICD-10-CM

## 2021-11-21 DIAGNOSIS — I5032 Chronic diastolic (congestive) heart failure: Secondary | ICD-10-CM

## 2021-11-21 DIAGNOSIS — J449 Chronic obstructive pulmonary disease, unspecified: Secondary | ICD-10-CM

## 2021-11-21 DIAGNOSIS — F17200 Nicotine dependence, unspecified, uncomplicated: Secondary | ICD-10-CM

## 2021-11-21 DIAGNOSIS — Z7901 Long term (current) use of anticoagulants: Secondary | ICD-10-CM

## 2021-11-21 MED ORDER — POTASSIUM CHLORIDE ER 10 MEQ PO CPCR: 10.0000 meq | ORAL_CAPSULE | Freq: Two times a day (BID) | ORAL | 1 refills | Status: AC

## 2021-11-21 MED ORDER — METOPROLOL TARTRATE 50 MG PO TABS: 50.0000 mg | ORAL_TABLET | Freq: Two times a day (BID) | ORAL | 1 refills | Status: AC

## 2021-11-21 MED ORDER — FUROSEMIDE 40 MG PO TABS: 60.0000 mg | ORAL_TABLET | Freq: Every day | ORAL | 0 refills | Status: DC

## 2021-11-21 MED ORDER — LOSARTAN POTASSIUM 100 MG PO TABS: 100.0000 mg | ORAL_TABLET | Freq: Every day | ORAL | 1 refills | Status: AC

## 2021-11-21 NOTE — Telephone Encounter (Signed)
[facility] attempted call to patient to offer counseling services, no answer, voicemail was left.

## 2021-11-21 NOTE — Progress Notes (Signed)
BP (!) 153/88   Pulse 66   Temp (!) 97.2 F (36.2 C)   Wt (!) 350 lb (158.8 kg)   SpO2 97%   BMI 47.47 kg/m    Subjective:    Patient ID: Adam Marquez, male    DOB: 08/27/1956, 65 y.o.   MRN: [id number]  HPI: Adam Marquez is a 65 y.o. male presenting on 11/21/2021 for No chief complaint on file.   HPI    Pt is 65yoM who presents for follow up.  He was last in office on 08/24/21.  He says he has had a lot going on.  He ran out of almost all of his meds.  He has not been renewed for medassist yet.  He continues to have B knee pain and some issues with his breathing but nothing new.  He hasn't seen his cardiologist since March 2021.    He sees cardiology for CHF and AF.   Cardiology referred pt for sleep study but pt says that never got scheduled.  Relevant past medical, surgical, family and social history reviewed and updated as indicated. Interim medical history since our last visit reviewed. Allergies and medications reviewed and updated.    Current Outpatient Medications:    diphenhydramine-acetaminophen (TYLENOL PM) 25-500 MG TABS tablet, Take 1 tablet by mouth at bedtime as needed., Disp: , Rfl:    fluticasone-salmeterol (ADVAIR HFA) 115-21 MCG/ACT inhaler, Inhale 2 puffs into the lungs 2 (two) times daily., Disp: 3 each, Rfl: 3   Polyvinyl Alcohol-Povidone (CLEAR EYES ALL SEASONS) 5-6 MG/ML SOLN, Apply 1 drop to eye daily as needed (for allergies)., Disp: , Rfl:    albuterol (PROVENTIL HFA) 108 (90 Base) MCG/ACT inhaler, INHALE 2 PUFFS BY MOUTH EVERY 4 HOURS AS NEEDED FOR COUGHING, WHEEZING, OR SHORTNESS OF BREATH (Patient not taking: Reported on 11/21/2021), Disp: 3 each, Rfl: 0   furosemide (LASIX) 40 MG tablet, Take 1.5 tablets (60 mg total) by mouth daily. (Patient not taking: Reported on 11/21/2021), Disp: 90 tablet, Rfl: 0   losartan (COZAAR) 100 MG tablet, Take 1 tablet (100 mg total) by mouth daily. (Patient not taking: Reported on 11/21/2021), Disp:  90 tablet, Rfl: 0   metoprolol tartrate (LOPRESSOR) 50 MG tablet, Take 1 tablet (50 mg total) by mouth 2 (two) times daily. (Patient not taking: Reported on 11/21/2021), Disp: 180 tablet, Rfl: 0   potassium chloride (MICRO-K) 10 MEQ CR capsule, Take 1 capsule (10 mEq total) by mouth 2 (two) times daily. (Patient not taking: Reported on 11/21/2021), Disp: 180 capsule, Rfl: 0   rivaroxaban (XARELTO) 20 MG TABS tablet, Take 1 tablet (20 mg total) by mouth daily with supper. (Patient not taking: Reported on 11/21/2021), Disp: 90 tablet, Rfl: 0   Review of Systems  Per HPI unless specifically indicated above     Objective:    BP (!) 153/88   Pulse 66   Temp (!) 97.2 F (36.2 C)   Wt (!) 350 lb (158.8 kg)   SpO2 97%   BMI 47.47 kg/m   Wt Readings from Last 3 Encounters:  11/21/21 (!) 350 lb (158.8 kg)  08/24/21 (!) 329 lb (149.2 kg)  05/25/21 (!) 327 lb (148.3 kg)    Physical Exam Vitals reviewed.  Constitutional:      General: He is not in acute distress.    Appearance: He is well-developed. He is not toxic-appearing.  HENT:     Head: Normocephalic and atraumatic.  Cardiovascular:     Rate  and Rhythm: Normal rate and regular rhythm.     Comments: RRR Pulmonary:     Effort: Pulmonary effort is normal.     Breath sounds: Normal breath sounds. No wheezing.  Abdominal:     General: Bowel sounds are normal.     Palpations: Abdomen is soft.     Tenderness: There is no abdominal tenderness.  Musculoskeletal:     Cervical back: Neck supple.     Right lower leg: Edema present.     Left lower leg: Edema present.     Comments: Chronic stasis BLE  Lymphadenopathy:     Cervical: No cervical adenopathy.  Skin:    General: Skin is warm and dry.  Neurological:     Mental Status: He is alert and oriented to person, place, and time.  Psychiatric:        Attention and Perception: Attention normal.        Speech: Speech normal.        Behavior: Behavior normal. Behavior is  cooperative.    Results for orders placed or performed during the hospital encounter of 08/28/21  Hemoglobin A1c  Result Value Ref Range   Hgb A1c MFr Bld 5.9 (H) 4.8 - 5.6 %   Mean Plasma Glucose 122.63 mg/dL  Lipid panel  Result Value Ref Range   Cholesterol 154 0 - 200 mg/dL   Triglycerides 371 <696 mg/dL   HDL 39 (L) >78 mg/dL   Total CHOL/HDL Ratio 3.9 RATIO   VLDL 23 0 - 40 mg/dL   LDL Cholesterol 92 0 - 99 mg/dL  Comprehensive metabolic panel  Result Value Ref Range   Sodium 139 135 - 145 mmol/L   Potassium 3.8 3.5 - 5.1 mmol/L   Chloride 98 98 - 111 mmol/L   CO2 30 22 - 32 mmol/L   Glucose, Bld 121 (H) 70 - 99 mg/dL   BUN 14 8 - 23 mg/dL   Creatinine, Ser 9.38 0.61 - 1.24 mg/dL   Calcium 9.1 8.9 - 10.1 mg/dL   Total Protein 6.7 6.5 - 8.1 g/dL   Albumin 3.5 3.5 - 5.0 g/dL   AST 34 15 - 41 U/L   ALT 15 0 - 44 U/L   Alkaline Phosphatase 112 38 - 126 U/L   Total Bilirubin 1.4 (H) 0.3 - 1.2 mg/dL   GFR, Estimated >75 >10 mL/min   Anion gap 11 5 - 15  CBC  Result Value Ref Range   WBC 7.4 4.0 - 10.5 K/uL   RBC 3.78 (L) 4.22 - 5.81 MIL/uL   Hemoglobin 14.6 13.0 - 17.0 g/dL   HCT 25.8 52.7 - 78.2 %   MCV 112.7 (H) 80.0 - 100.0 fL   MCH 38.6 (H) 26.0 - 34.0 pg   MCHC 34.3 30.0 - 36.0 g/dL   RDW 42.3 (H) 53.6 - 14.4 %   Platelets 190 150 - 400 K/uL   nRBC 0.0 0.0 - 0.2 %      Assessment & Plan:    Encounter Diagnoses  Name Primary?   Essential hypertension Yes   Chronic pain of right knee    Chronic pain of left knee    Chronic obstructive pulmonary disease, unspecified COPD type (HCC)    Tobacco use disorder    Chronic diastolic heart failure (HCC)    Chronic atrial fibrillation (HCC)    Osteoarthritis of both knees, unspecified osteoarthritis type    Anticoagulated    Morbid obesity (HCC)      -reviewed labs  and xrays from august with pt -will Refer to orthopedics for knees. -will Refer to cardiology for AF and CHF -Pt to go to [facility] for  re-enrollment and Med[facility]ist renewal and to complete cafa -Will have [facility] contact pt about appt -At next appt with refer for sleep study.  Pt in agreement about waiting until next time for this referral -pt needs to renew with medassist so rx sent to local pharmacy.  Xarelto rx not sent due to cost; he is aware that he needs to be on this rx.    -pt to follow up 1 month

## 2021-11-21 NOTE — Patient Instructions (Signed)
-  Refer to ortho for knees. -Refer to cards for AF -Pt to go to [facility] for re-enrollment and Med[facility]ist renewal and to complete cafa -Will have [facility] contact pt about appt -At next appt with refer for sleep study

## 2021-11-27 IMAGING — DX DG KNEE COMPLETE 4+V*L*
4 series · 4 of 4 positions shown · non-contrast
Comparison: None

CLINICAL DATA: BILATERAL knee pain for 1 year

EXAM:
LEFT KNEE - COMPLETE 4+ VIEW

[knee ap]
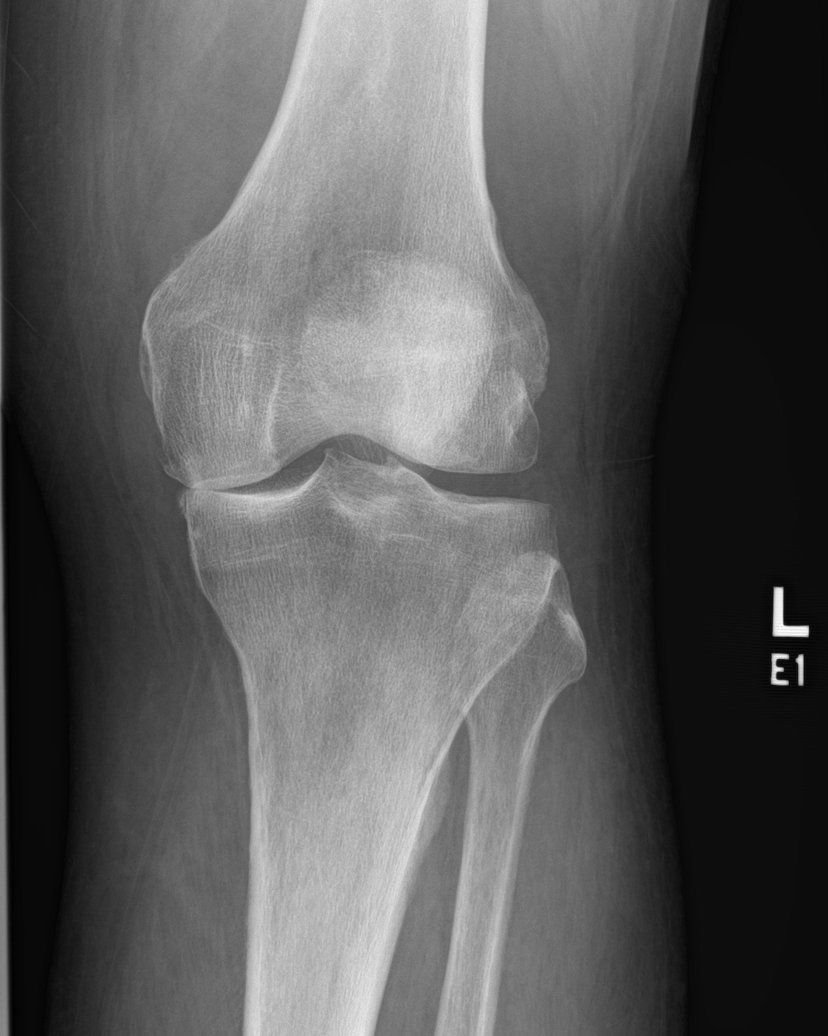

[knee lat]
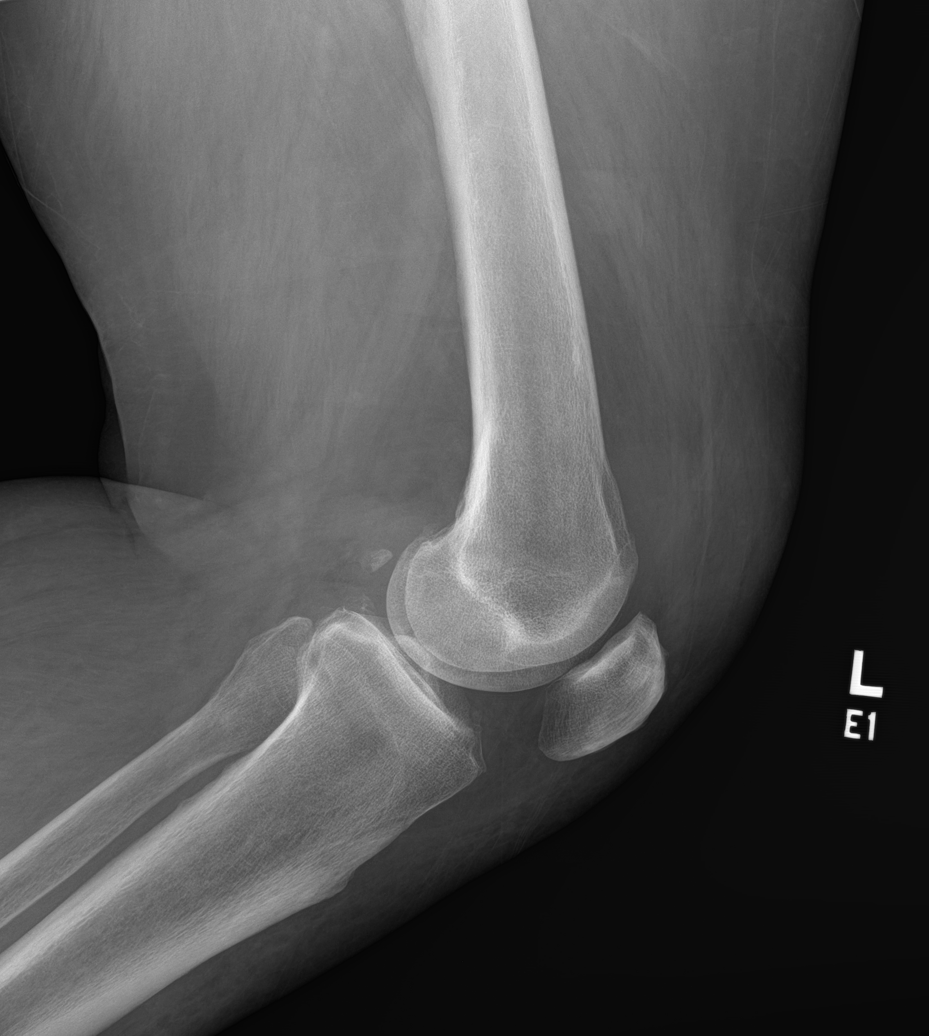

[knee obl (1 of 2)]
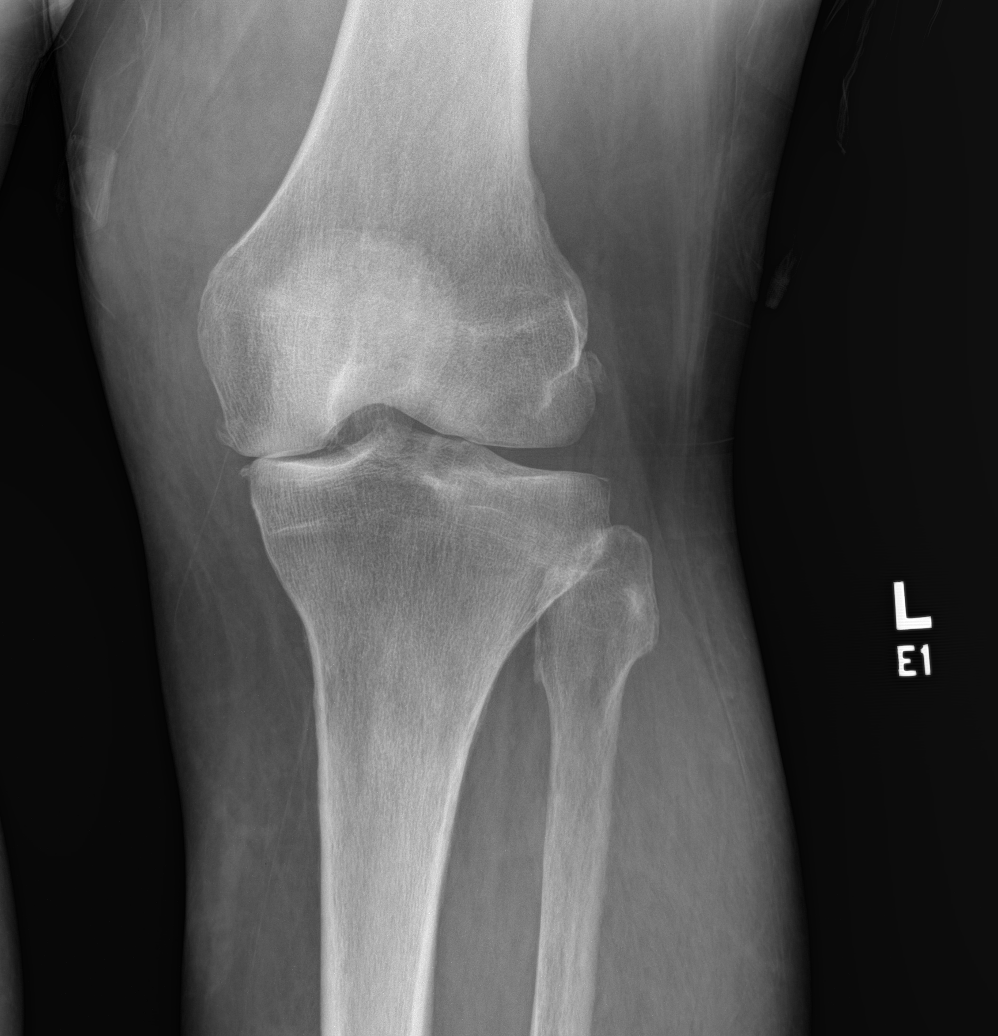

[knee obl (2 of 2)]
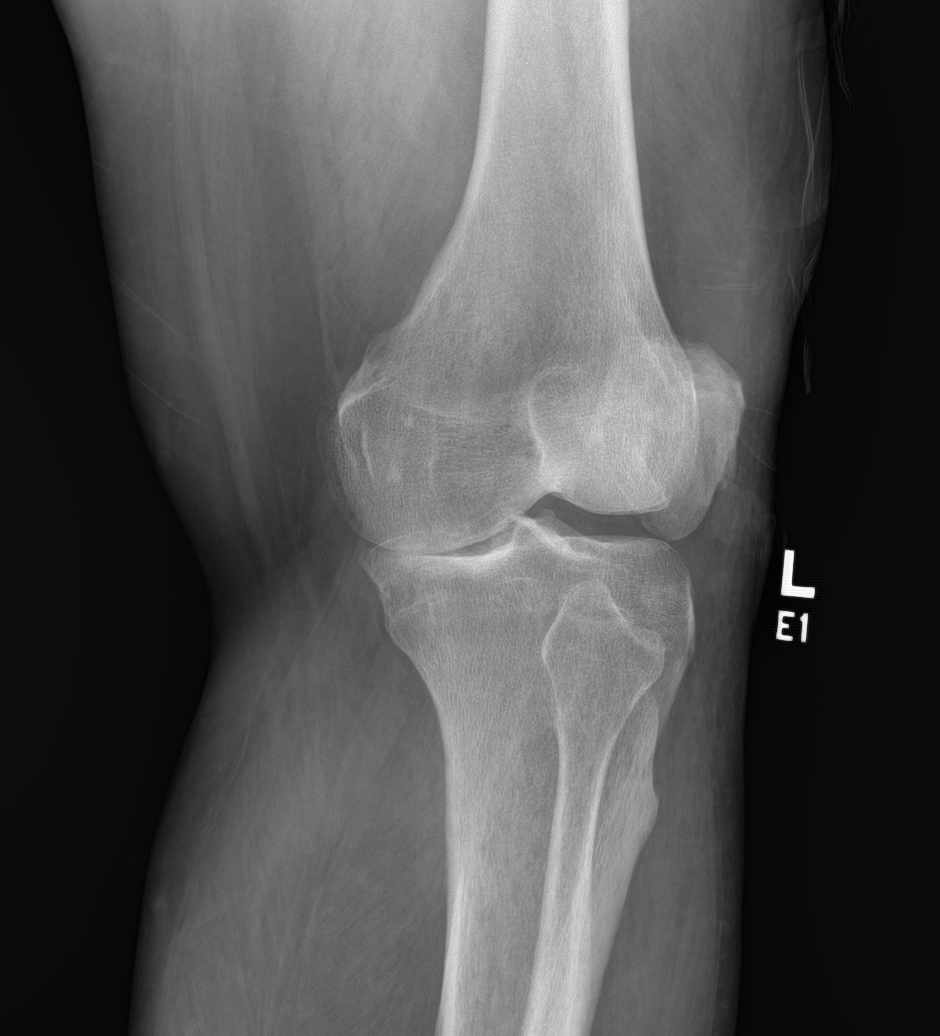

[4 of 4 positions shown; findings below may reference images not displayed]

FINDINGS: Osseous mineralization normal.

The medial compartment joint space narrowing with bone-on-bone
appearance and minimal spur formation.

No fracture, dislocation, or bone destruction.

Small joint effusion.
IMPRESSION: Degenerative changes LEFT knee at medial compartment with
bone-on-bone appearance and small joint effusion.

## 2021-12-11 ENCOUNTER — Ambulatory Visit: Payer: Self-pay | Admitting: Physician Assistant

## 2021-12-21 ENCOUNTER — Other Ambulatory Visit: Payer: Self-pay | Admitting: Physician Assistant

## 2022-01-10 ENCOUNTER — Encounter: Payer: Self-pay | Admitting: Radiology

## 2023-07-15 DIAGNOSIS — R0602 Shortness of breath: Secondary | ICD-10-CM | POA: Diagnosis not present

## 2023-07-15 DIAGNOSIS — I499 Cardiac arrhythmia, unspecified: Secondary | ICD-10-CM | POA: Diagnosis not present

## 2023-07-15 DIAGNOSIS — I5020 Unspecified systolic (congestive) heart failure: Secondary | ICD-10-CM | POA: Diagnosis not present

## 2023-07-15 DIAGNOSIS — R601 Generalized edema: Secondary | ICD-10-CM | POA: Diagnosis not present

## 2023-10-31 DIAGNOSIS — R0600 Dyspnea, unspecified: Secondary | ICD-10-CM | POA: Diagnosis not present
